# Patient Record
Sex: Male | Born: 1965 | Race: White | Hispanic: No | Marital: Single | Smoking: Current every day smoker
Health system: Southern US, Community
[De-identification: ages and names within clinical notes are randomized; demographics above are authoritative.]

## PROBLEM LIST (undated history)

## (undated) DIAGNOSIS — F319 Bipolar disorder, unspecified: Secondary | ICD-10-CM

---

## 2012-09-05 ENCOUNTER — Emergency Department (HOSPITAL_COMMUNITY): Payer: Self-pay

## 2012-09-05 ENCOUNTER — Emergency Department (HOSPITAL_COMMUNITY)
Admission: EM | Admit: 2012-09-05 | Discharge: 2012-09-05 | Disposition: A | Discharge status: Home / Self Care | Payer: Self-pay | Attending: Emergency Medicine | Admitting: Emergency Medicine

## 2012-09-05 ENCOUNTER — Encounter (HOSPITAL_COMMUNITY): Payer: Self-pay | Admitting: Emergency Medicine

## 2012-09-05 DIAGNOSIS — Z8659 Personal history of other mental and behavioral disorders: Secondary | ICD-10-CM | POA: Insufficient documentation

## 2012-09-05 DIAGNOSIS — R6883 Chills (without fever): Secondary | ICD-10-CM | POA: Insufficient documentation

## 2012-09-05 DIAGNOSIS — R05 Cough: Secondary | ICD-10-CM | POA: Insufficient documentation

## 2012-09-05 DIAGNOSIS — F17200 Nicotine dependence, unspecified, uncomplicated: Secondary | ICD-10-CM | POA: Insufficient documentation

## 2012-09-05 DIAGNOSIS — J3489 Other specified disorders of nose and nasal sinuses: Secondary | ICD-10-CM | POA: Insufficient documentation

## 2012-09-05 DIAGNOSIS — R059 Cough, unspecified: Secondary | ICD-10-CM | POA: Insufficient documentation

## 2012-09-05 HISTORY — DX: Bipolar disorder, unspecified: F31.9

## 2012-09-05 MED ORDER — BENZONATATE 200 MG PO CAPS: 200.0000 mg | ORAL_CAPSULE | Freq: Three times a day (TID) | ORAL | Status: DC | PRN
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
## 2012-09-05 NOTE — ED Provider Notes (Signed)
History    CSN: [id number] Arrival date & time 09/05/12  3329  First MD Initiated Contact with Patient 09/05/12 [phone]     Chief Complaint  Patient presents with  . Cough   (Consider location/radiation/quality/duration/timing/severity/associated sxs/prior Treatment) HPI Comments: 47 y.o. Homeless Male with PMHx of bipolar disorder presents today complaining of cough that started three weeks ago. Pt states he was treated for pneumonia with Bactrim in [place], but left town after only 3 days of treatment. He arrives here in [place] afraid that pneumonia has not cleared. Cough is described as productive with yellow sputum with some associated chest pain. Admits congestion. Denies fever, nausea, vomiting.   Pt is a current smoker.   Patient is a 47 y.o. male presenting with cough.  Cough Associated symptoms: chills   Associated symptoms: no chest pain, no diaphoresis, no ear pain, no fever, no headaches, no rash, no rhinorrhea and no shortness of breath    Past Medical History  Diagnosis Date  . Bipolar 1 disorder    History reviewed. No pertinent past surgical history. History reviewed. No pertinent family history. History  Substance Use Topics  . Smoking status: Current Every Day Smoker -- 0.50 packs/day    Types: Cigarettes  . Smokeless tobacco: Not on file  . Alcohol Use: Yes     Comment: occasional     Review of Systems  Constitutional: Positive for chills. Negative for fever and diaphoresis.  HENT: Positive for congestion. Negative for ear pain, rhinorrhea, neck pain and neck stiffness.   Eyes: Negative for visual disturbance.  Respiratory: Positive for cough. Negative for apnea, chest tightness and shortness of breath.   Cardiovascular: Negative for chest pain and palpitations.  Gastrointestinal: Negative for nausea, vomiting, diarrhea and constipation.  Genitourinary: Negative for dysuria.  Musculoskeletal: Negative for gait problem.  Skin: Negative for rash.   Neurological: Negative for dizziness, weakness, light-headedness, numbness and headaches.    Allergies  Review of patient's allergies indicates no known allergies.  Home Medications  No current outpatient prescriptions on file. BP 136/78  Pulse 90  Temp(Src) 98.6 F (37 C) (Oral)  Resp 16  SpO2 99% Physical Exam  Nursing note and vitals reviewed. Constitutional: He is oriented to person, place, and time. He appears well-developed and well-nourished. No distress.  HENT:  Head: Normocephalic and atraumatic.  Eyes: Conjunctivae and EOM are normal.  Neck: Normal range of motion. Neck supple.  No meningeal signs  Cardiovascular: Normal rate, regular rhythm and normal heart sounds.  Exam reveals no gallop and no friction rub.   No murmur heard. Pulmonary/Chest: Effort normal. No respiratory distress. He has no wheezes. He exhibits no tenderness.  Lung sounds are diminished bilaterally  Abdominal: Soft. Bowel sounds are normal. He exhibits no distension. There is no tenderness. There is no rebound and no guarding.  Musculoskeletal: Normal range of motion. He exhibits no edema and no tenderness.  Neurological: He is alert and oriented to person, place, and time. No cranial nerve deficit.  Skin: Skin is warm and dry. He is not diaphoretic. No erythema.  Psychiatric:  nervous    ED Course  Procedures (including critical care time) Labs Reviewed - No data to display Dg Chest 2 View  09/05/2012   *RADIOLOGY REPORT*  Clinical Data: Cough, shortness of breath and diaphoresis.  CHEST - 2 VIEW  Comparison: None.  Findings: Trachea is midline.  Heart size normal.  Lungs are mildly hyperinflated but clear.  No pleural fluid.  IMPRESSION: No acute  findings.   Original Report Authenticated By: Leanna Battles, M.D.   1. Cough    Discharge Medication List as of 09/05/2012 11:33 AM    START taking these medications   Details  benzonatate (TESSALON) 200 MG capsule Take 1 capsule (200 mg total)  by mouth 3 (three) times daily as needed for cough., Starting 09/05/2012, Until Discontinued, Print        MDM  Pt CXR negative for acute infiltrate. Patients symptoms are consistent with URI, likely viral etiology. Discussed that antibiotics are not indicated for viral infections. Pt will be discharged with symptomatic treatment.  Verbalizes understanding and is agreeable with plan. Pt is hemodynamically stable & in NAD prior to dc.   Glade Nurse, PA-C 09/05/12 1149

## 2012-09-05 NOTE — ED Notes (Signed)
Pt reports for the last week he has had productive yellow cough. States he lives in a shelter and has been doing part time work but has been unable to work due to illness. Denies fever. States cold sweats. Pt current smoker.

## 2012-09-06 NOTE — ED Provider Notes (Signed)
Medical screening examination/treatment/procedure(s) were performed by non-physician practitioner and as supervising physician I was immediately available for consultation/collaboration.  Olivia Mackie, MD 09/06/12 1420

## 2012-11-01 ENCOUNTER — Inpatient Hospital Stay: Payer: Self-pay

## 2012-11-09 ENCOUNTER — Emergency Department (HOSPITAL_COMMUNITY): Payer: Self-pay

## 2012-11-09 ENCOUNTER — Encounter (HOSPITAL_COMMUNITY): Payer: Self-pay | Admitting: Emergency Medicine

## 2012-11-09 ENCOUNTER — Emergency Department (HOSPITAL_COMMUNITY)
Admission: EM | Admit: 2012-11-09 | Discharge: 2012-11-10 | Disposition: A | Discharge status: Home / Self Care | Payer: Self-pay | Attending: Emergency Medicine | Admitting: Emergency Medicine

## 2012-11-09 DIAGNOSIS — T07XXXA Unspecified multiple injuries, initial encounter: Secondary | ICD-10-CM | POA: Insufficient documentation

## 2012-11-09 DIAGNOSIS — F1010 Alcohol abuse, uncomplicated: Secondary | ICD-10-CM | POA: Insufficient documentation

## 2012-11-09 DIAGNOSIS — F17200 Nicotine dependence, unspecified, uncomplicated: Secondary | ICD-10-CM | POA: Insufficient documentation

## 2012-11-09 DIAGNOSIS — S6990XA Unspecified injury of unspecified wrist, hand and finger(s), initial encounter: Secondary | ICD-10-CM | POA: Insufficient documentation

## 2012-11-09 DIAGNOSIS — Z8659 Personal history of other mental and behavioral disorders: Secondary | ICD-10-CM | POA: Insufficient documentation

## 2012-11-09 DIAGNOSIS — S59909A Unspecified injury of unspecified elbow, initial encounter: Secondary | ICD-10-CM | POA: Insufficient documentation

## 2012-11-09 DIAGNOSIS — F10929 Alcohol use, unspecified with intoxication, unspecified: Secondary | ICD-10-CM

## 2012-11-09 LAB — CBC WITH DIFFERENTIAL/PLATELET
Eosinophils Absolute: 0.2 10*3/uL (ref 0.0–0.7)
HCT: 42.4 % (ref 39.0–52.0)
Hemoglobin: 14.6 g/dL (ref 13.0–17.0)
Lymphs Abs: 3.5 10*3/uL (ref 0.7–4.0)
MCH: 32.4 pg (ref 26.0–34.0)
MCV: 94 fL (ref 78.0–100.0)
Monocytes Absolute: 0.9 10*3/uL (ref 0.1–1.0)
Monocytes Relative: 9 % (ref 3–12)
Neutrophils Relative %: 53 % (ref 43–77)
RBC: 4.51 MIL/uL (ref 4.22–5.81)

## 2012-11-09 LAB — COMPREHENSIVE METABOLIC PANEL
Alkaline Phosphatase: 59 U/L (ref 39–117)
BUN: 5 mg/dL — ABNORMAL LOW (ref 6–23)
Creatinine, Ser: 0.69 mg/dL (ref 0.50–1.35)
GFR calc Af Amer: >90 mL/min (ref >90)
Glucose, Bld: 93 mg/dL (ref 70–99)
Potassium: 3.3 mEq/L — ABNORMAL LOW (ref 3.5–5.1)
Total Bilirubin: 0.4 mg/dL (ref 0.3–1.2)
Total Protein: 6.7 g/dL (ref 6.0–8.3)

## 2012-11-09 LAB — ETHANOL: Alcohol, Ethyl (B): 304 mg/dL — ABNORMAL HIGH (ref 0–11)

## 2012-11-09 LAB — POCT I-STAT, CHEM 8
BUN: <3 mg/dL — ABNORMAL LOW (ref 6–23)
Hemoglobin: 16 g/dL (ref 13.0–17.0)
Sodium: 144 mEq/L (ref 135–145)
TCO2: 24 mmol/L (ref 0–100)

## 2012-11-09 LAB — RAPID URINE DRUG SCREEN, HOSP PERFORMED
Cocaine: NOT DETECTED
Opiates: NOT DETECTED
Tetrahydrocannabinol: NOT DETECTED
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
## 2012-11-09 NOTE — ED Notes (Signed)
Pt. Agitated, roaming up to the desk and walking around in the hall requesting something to eat. Pt. Made aware that he can not eat until his x-rays are resulted. Notified EDP, Pollina, MD.

## 2012-11-09 NOTE — ED Notes (Signed)
Bed: WA21 Expected date:  Expected time:  Means of arrival:  Comments: ems ETOH
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
## 2012-12-01 ENCOUNTER — Inpatient Hospital Stay (HOSPITAL_COMMUNITY)
Admission: AD | Admit: 2012-12-01 | Discharge: 2012-12-05 | DRG: 897 | Disposition: A | Discharge status: Home / Self Care | Payer: No Typology Code available for payment source | Source: Intra-hospital | Attending: Psychiatry | Admitting: Psychiatry

## 2012-12-01 ENCOUNTER — Emergency Department (HOSPITAL_COMMUNITY)
Admission: EM | Admit: 2012-12-01 | Discharge: 2012-12-01 | Disposition: A | Discharge status: Other Acute Inpatient Hospital | Payer: Self-pay | Attending: Emergency Medicine | Admitting: Emergency Medicine

## 2012-12-01 ENCOUNTER — Encounter (HOSPITAL_COMMUNITY): Payer: Self-pay | Admitting: Emergency Medicine

## 2012-12-01 DIAGNOSIS — F1020 Alcohol dependence, uncomplicated: Secondary | ICD-10-CM | POA: Insufficient documentation

## 2012-12-01 DIAGNOSIS — Z5900 Homelessness unspecified: Secondary | ICD-10-CM | POA: Insufficient documentation

## 2012-12-01 DIAGNOSIS — F10239 Alcohol dependence with withdrawal, unspecified: Principal | ICD-10-CM | POA: Diagnosis present

## 2012-12-01 DIAGNOSIS — F319 Bipolar disorder, unspecified: Secondary | ICD-10-CM | POA: Insufficient documentation

## 2012-12-01 DIAGNOSIS — Z79899 Other long term (current) drug therapy: Secondary | ICD-10-CM | POA: Insufficient documentation

## 2012-12-01 DIAGNOSIS — F3110 Bipolar disorder, current episode manic without psychotic features, unspecified: Secondary | ICD-10-CM | POA: Diagnosis present

## 2012-12-01 DIAGNOSIS — F10939 Alcohol use, unspecified with withdrawal, unspecified: Principal | ICD-10-CM | POA: Diagnosis present

## 2012-12-01 DIAGNOSIS — R569 Unspecified convulsions: Secondary | ICD-10-CM | POA: Diagnosis present

## 2012-12-01 DIAGNOSIS — F17200 Nicotine dependence, unspecified, uncomplicated: Secondary | ICD-10-CM | POA: Insufficient documentation

## 2012-12-01 DIAGNOSIS — F10230 Alcohol dependence with withdrawal, uncomplicated: Secondary | ICD-10-CM | POA: Diagnosis present

## 2012-12-01 LAB — COMPREHENSIVE METABOLIC PANEL
ALT: 22 U/L (ref 0–53)
AST: 35 U/L (ref 0–37)
CO2: 25 mEq/L (ref 19–32)
Chloride: 105 mEq/L (ref 96–112)
GFR calc non Af Amer: >90 mL/min (ref >90)
Sodium: 142 mEq/L (ref 135–145)
Total Bilirubin: 0.2 mg/dL — ABNORMAL LOW (ref 0.3–1.2)

## 2012-12-01 LAB — CBC
Platelets: 303 10*3/uL (ref 150–400)
RBC: 4.49 MIL/uL (ref 4.22–5.81)
WBC: 12.1 10*3/uL — ABNORMAL HIGH (ref 4.0–10.5)

## 2012-12-01 LAB — RAPID URINE DRUG SCREEN, HOSP PERFORMED
Amphetamines: NOT DETECTED
Barbiturates: NOT DETECTED
Benzodiazepines: NOT DETECTED

## 2012-12-01 LAB — SALICYLATE LEVEL: Salicylate Lvl: <2 mg/dL — ABNORMAL LOW (ref 2.8–20.0)

## 2012-12-01 LAB — ETHANOL: Alcohol, Ethyl (B): 270 mg/dL — ABNORMAL HIGH (ref 0–11)

## 2012-12-01 MED ORDER — LORAZEPAM 1 MG PO TABS
0.0000 mg | ORAL_TABLET | Freq: Four times a day (QID) | ORAL | Status: DC
  Filled 2012-12-01: qty 2

## 2012-12-01 MED ORDER — NICOTINE 21 MG/24HR TD PT24
21.0000 mg | MEDICATED_PATCH | Freq: Every day | TRANSDERMAL | Status: DC
  Administered 2012-12-01: 21 mg via TRANSDERMAL
  Filled 2012-12-01: qty 1

## 2012-12-01 MED ORDER — IBUPROFEN 200 MG PO TABS: 600.0000 mg | ORAL_TABLET | Freq: Three times a day (TID) | ORAL | Status: DC | PRN

## 2012-12-01 MED ORDER — ACETAMINOPHEN 325 MG PO TABS: 650.0000 mg | ORAL_TABLET | ORAL | Status: DC | PRN

## 2012-12-01 MED ORDER — LORAZEPAM 1 MG PO TABS
0.0000 mg | ORAL_TABLET | Freq: Two times a day (BID) | ORAL | Status: DC
  Administered 2012-12-01: 2 mg via ORAL

## 2012-12-01 MED ORDER — TRAZODONE HCL 100 MG PO TABS
300.0000 mg | ORAL_TABLET | Freq: Every day | ORAL | Status: DC
  Administered 2012-12-01: 300 mg via ORAL
  Filled 2012-12-01: qty 3

## 2012-12-01 MED ORDER — ZOLPIDEM TARTRATE 5 MG PO TABS: 5.0000 mg | ORAL_TABLET | Freq: Every evening | ORAL | Status: DC | PRN

## 2012-12-01 MED ORDER — ONDANSETRON HCL 4 MG PO TABS: 4.0000 mg | ORAL_TABLET | Freq: Three times a day (TID) | ORAL | Status: DC | PRN

## 2012-12-01 MED ORDER — ALUM & MAG HYDROXIDE-SIMETH 200-200-20 MG/5ML PO SUSP: 30.0000 mL | ORAL | Status: DC | PRN
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
## 2012-12-01 NOTE — BH Assessment (Signed)
Assessment Note  Donald Orozco is an 47 y.o. male who presents to [facility] voluntarily with the chief complaint of excessive alcohol consumption. Patient states that he self medicates by consuming at a minimum of three 40oz beers daily in order to prevent "the shakes" per patient. Patient reports that he now has a job in [employer] and that he desires to get "clean" in order to maintain employment. Patient reports withdrawal symptoms that include: fever/chills, tremors, history of blackouts, sweats, and irritability. Patient's BAL on admission was 270. Patient reports he is currently homeless which influences his depressive symptoms that include: insomnia, feelings of irritability, guilt, and isolating behaviors.  Patient denies any other substance use (UDS substantiated with no other positive readings.) Patient reports that his current alcohol dependence interferes with his daily activities and that he now desires to receive help in battling his alcoholism.  Patient reports no current social supports and denies SI/HI/AVH at this time.    Axis I: Alcohol Dependence Axis II: Deferred Axis III:  Past Medical History  Diagnosis Date  . Bipolar 1 disorder    Axis IV: housing problems, other psychosocial or environmental problems, problems related to social environment and problems with primary support group Axis V: 41-50 serious symptoms  Past Medical History:  Past Medical History  Diagnosis Date  . Bipolar 1 disorder     History reviewed. No pertinent past surgical history.  Family History: History reviewed. No pertinent family history.  Social History:  reports that he has been smoking Cigarettes.  He has been smoking about 1.00 pack per day. He has never used smokeless tobacco. He reports that he drinks about 14.4 ounces of alcohol per week. He reports that he does not use illicit drugs.  Additional Social History:  Alcohol / Drug Use History of alcohol / drug use?: Yes Substance #1 Name of  Substance 1: ETOH 1 - Age of First Use: 14 1 - Amount (size/oz): At minimum (3) 40 oz beers daily 1 - Frequency: daily 1 - Duration: years 1 - Last Use / Amount: 12/01/12- (5) 40 oz beers   CIWA: CIWA-Ar BP: 143/77 mmHg Pulse Rate: 97 Nausea and Vomiting: mild nausea with no vomiting Tactile Disturbances: none Tremor: severe, even with arms not extended Auditory Disturbances: not present Paroxysmal Sweats: two Visual Disturbances: not present Anxiety: mildly anxious Headache, Fullness in Head: none present Agitation: normal activity Orientation and Clouding of Sensorium: oriented and can do serial additions CIWA-Ar Total: 11 COWS:    Allergies: No Known Allergies  Home Medications:  (Not in a hospital admission)  OB/GYN Status:  No LMP for male patient.  General Assessment Data Location of Assessment: [facility] ED Is this a Tele or Face-to-Face Assessment?: Face-to-Face Is this an Initial Assessment or a Re-assessment for this encounter?: Initial Assessment Living Arrangements: Other (Comment) (Homeless) Can pt return to current living arrangement?: Yes Admission Status: Voluntary Is patient capable of signing voluntary admission?: Yes Transfer from: Acute Hospital Referral Source: Self/Family/Friend     [facility] Crisis Care Plan Living Arrangements: Other (Comment) (Homeless) Name of Psychiatrist: None Name of Therapist: None  Education Status Is patient currently in school?: No  Risk to self Suicidal Ideation: No Suicidal Intent: No Is patient at risk for suicide?: No Suicidal Plan?: No Access to Means: No What has been your use of drugs/alcohol within the last 12 months?: None Previous Attempts/Gestures: No How many times?: 0 Other Self Harm Risks: None Triggers for Past Attempts: None known Intentional Self Injurious Behavior: None  Family Suicide History: No Recent stressful life event(s): Loss (Comment) (Patient is currently homeless) Persecutory  voices/beliefs?: No Depression: Yes Depression Symptoms: Tearfulness;Isolating;Fatigue;Loss of interest in usual pleasures;Feeling worthless/self pity;Feeling angry/irritable Substance abuse history and/or treatment for substance abuse?: No Suicide prevention information given to non-admitted patients: Not applicable  Risk to Others Homicidal Ideation: No Thoughts of Harm to Others: No Current Homicidal Intent: No Current Homicidal Plan: No Access to Homicidal Means: No Identified Victim: None History of harm to others?: No Assessment of Violence: None Noted Violent Behavior Description: None Does patient have access to weapons?: No Criminal Charges Pending?: No (Patient denies) Does patient have a court date: No  Psychosis Hallucinations: None noted Delusions: None noted  Mental Status Report Appear/Hygiene: Disheveled;Poor hygiene Eye Contact: Poor Motor Activity: Freedom of movement;Unremarkable Speech: Logical/coherent Level of Consciousness: Restless Mood: Depressed;Anxious Affect: Anxious;Depressed Anxiety Level: Minimal Thought Processes: Coherent;Relevant Judgement: Impaired Orientation: Person;Place;Time;Situation Obsessive Compulsive Thoughts/Behaviors: None  Cognitive Functioning Concentration: Decreased Memory: Recent Intact;Remote Intact IQ: Average Insight: Fair Impulse Control: Poor Appetite: Poor Weight Loss: 40 Weight Gain: 0 Sleep: Decreased Total Hours of Sleep:  (Varies) Vegetative Symptoms: None  ADLScreening [facility] Assessment Services) Patient's cognitive ability adequate to safely complete daily activities?: Yes Patient able to express need for assistance with ADLs?: Yes Independently performs ADLs?: Yes (appropriate for developmental age)  Prior Inpatient Therapy Prior Inpatient Therapy: Yes Prior Therapy Dates: "years ago" per pt Prior Therapy Facilty/Provider(s): Facility in [place] Reason for Treatment: Detox  Prior Outpatient  Therapy Prior Outpatient Therapy: No  ADL Screening (condition at time of admission) Patient's cognitive ability adequate to safely complete daily activities?: Yes Is the patient deaf or have difficulty hearing?: No Does the patient have difficulty seeing, even when wearing glasses/contacts?: No Does the patient have difficulty concentrating, remembering, or making decisions?: No Patient able to express need for assistance with ADLs?: Yes Does the patient have difficulty dressing or bathing?: No Independently performs ADLs?: Yes (appropriate for developmental age) Does the patient have difficulty walking or climbing stairs?: No Weakness of Legs: None Weakness of Arms/Hands: None  Home Assistive Devices/Equipment Home Assistive Devices/Equipment: None  Therapy Consults (therapy consults require a physician order) PT Evaluation Needed: No OT Evalulation Needed: No SLP Evaluation Needed: No Abuse/Neglect Assessment (Assessment to be complete while patient is alone) Physical Abuse: Denies Verbal Abuse: Denies Sexual Abuse: Denies Exploitation of patient/patient's resources: Denies Self-Neglect: Denies Values / Beliefs Cultural Requests During Hospitalization: None Spiritual Requests During Hospitalization: None Consults Spiritual Care Consult Needed: No Social Work Consult Needed: No Merchant [employer] officer (For Healthcare) Advance Directive: Patient does not have advance directive;Patient would not like information    Additional Information 1:1 In Past 12 Months?: No CIRT Risk: No Elopement Risk: No Does patient have medical clearance?: Yes     Disposition: Recommendation for inpatient admission-detox.  Disposition Initial Assessment Completed for this Encounter: Yes Disposition of Patient: Inpatient treatment program Type of inpatient treatment program: Adult  On Site Evaluation by:   Reviewed with Physician:    Janann Colonel C 12/01/2012 9:52 PM

## 2012-12-01 NOTE — ED Notes (Signed)
Pt reports that he wants to detox from alcohol.  Reports he recently left the [facility] and has been homeless for the past two weeks, and has a job at [employer] that he will lose if he cannot stay sober. Pt calm and cooperative in triage.

## 2012-12-01 NOTE — ED Provider Notes (Signed)
12/01/12 10:44 PM  I was not involved in this patient's care. He is going to be transferred to behavioral health for alcohol detox.  Layla Maw Jabrea Kallstrom, DO 12/01/12 2245

## 2012-12-01 NOTE — ED Provider Notes (Signed)
Medical screening examination/treatment/procedure(s) were performed by non-physician practitioner and as supervising physician I was immediately available for consultation/collaboration.    Gilda Crease, MD 12/01/12 Barry Brunner

## 2012-12-01 NOTE — ED Notes (Signed)
Pt changed into blue paper scrubs. Pt and belongings searched by security.  Pt has one black bag with clothing, cell phone, and billfold

## 2012-12-02 ENCOUNTER — Encounter (HOSPITAL_COMMUNITY): Payer: Self-pay | Admitting: Behavioral Health

## 2012-12-02 DIAGNOSIS — F10230 Alcohol dependence with withdrawal, uncomplicated: Secondary | ICD-10-CM | POA: Diagnosis present

## 2012-12-02 DIAGNOSIS — F3110 Bipolar disorder, current episode manic without psychotic features, unspecified: Secondary | ICD-10-CM | POA: Diagnosis present

## 2012-12-02 DIAGNOSIS — F10930 Alcohol use, unspecified with withdrawal, uncomplicated: Secondary | ICD-10-CM | POA: Diagnosis present

## 2012-12-02 MED ORDER — TRAZODONE HCL 100 MG PO TABS
100.0000 mg | ORAL_TABLET | Freq: Every evening | ORAL | Status: DC | PRN
  Administered 2012-12-02 – 2012-12-04 (×3): 100 mg via ORAL
  Filled 2012-12-02 (×2): qty 1
  Filled 2012-12-02: qty 14
  Filled 2012-12-02: qty 1

## 2012-12-02 MED ORDER — CHLORDIAZEPOXIDE HCL 25 MG PO CAPS: 25.0000 mg | ORAL_CAPSULE | Freq: Every day | ORAL | Status: DC

## 2012-12-02 MED ORDER — ADULT MULTIVITAMIN W/MINERALS CH
1.0000 | ORAL_TABLET | Freq: Every day | ORAL | Status: DC
  Administered 2012-12-02 – 2012-12-05 (×4): 1 via ORAL
  Filled 2012-12-02 (×6): qty 1

## 2012-12-02 MED ORDER — LORAZEPAM 1 MG PO TABS: 1.0000 mg | ORAL_TABLET | Freq: Four times a day (QID) | ORAL | Status: DC | PRN

## 2012-12-02 MED ORDER — ACETAMINOPHEN 325 MG PO TABS: 650.0000 mg | ORAL_TABLET | Freq: Four times a day (QID) | ORAL | Status: DC | PRN

## 2012-12-02 MED ORDER — CHLORDIAZEPOXIDE HCL 25 MG PO CAPS
25.0000 mg | ORAL_CAPSULE | Freq: Four times a day (QID) | ORAL | Status: AC | PRN
  Filled 2012-12-02: qty 1

## 2012-12-02 MED ORDER — NICOTINE 21 MG/24HR TD PT24
21.0000 mg | MEDICATED_PATCH | Freq: Every day | TRANSDERMAL | Status: DC
  Administered 2012-12-02: 21 mg via TRANSDERMAL
  Filled 2012-12-02 (×3): qty 1

## 2012-12-02 MED ORDER — FOLIC ACID 1 MG PO TABS
1.0000 mg | ORAL_TABLET | Freq: Every day | ORAL | Status: DC
  Administered 2012-12-02 – 2012-12-05 (×4): 1 mg via ORAL
  Filled 2012-12-02 (×6): qty 1

## 2012-12-02 MED ORDER — CHLORDIAZEPOXIDE HCL 25 MG PO CAPS
25.0000 mg | ORAL_CAPSULE | Freq: Four times a day (QID) | ORAL | Status: AC
  Administered 2012-12-02 – 2012-12-03 (×5): 25 mg via ORAL
  Filled 2012-12-02 (×6): qty 1

## 2012-12-02 MED ORDER — LORAZEPAM 1 MG PO TABS
0.0000 mg | ORAL_TABLET | Freq: Four times a day (QID) | ORAL | Status: DC
  Administered 2012-12-02 (×2): 1 mg via ORAL
  Filled 2012-12-02 (×3): qty 1

## 2012-12-02 MED ORDER — ONDANSETRON 4 MG PO TBDP: 4.0000 mg | ORAL_TABLET | Freq: Four times a day (QID) | ORAL | Status: AC | PRN

## 2012-12-02 MED ORDER — LORAZEPAM 1 MG PO TABS: 0.0000 mg | ORAL_TABLET | Freq: Two times a day (BID) | ORAL | Status: DC

## 2012-12-02 MED ORDER — ALUM & MAG HYDROXIDE-SIMETH 200-200-20 MG/5ML PO SUSP: 30.0000 mL | ORAL | Status: DC | PRN

## 2012-12-02 MED ORDER — LOPERAMIDE HCL 2 MG PO CAPS: 2.0000 mg | ORAL_CAPSULE | ORAL | Status: AC | PRN

## 2012-12-02 MED ORDER — THIAMINE HCL 100 MG/ML IJ SOLN: 100.0000 mg | Freq: Every day | INTRAMUSCULAR | Status: DC

## 2012-12-02 MED ORDER — VITAMIN B-1 100 MG PO TABS
100.0000 mg | ORAL_TABLET | Freq: Every day | ORAL | Status: DC
  Administered 2012-12-02 – 2012-12-05 (×4): 100 mg via ORAL
  Filled 2012-12-02 (×6): qty 1

## 2012-12-02 MED ORDER — CHLORDIAZEPOXIDE HCL 25 MG PO CAPS
25.0000 mg | ORAL_CAPSULE | ORAL | Status: DC
  Administered 2012-12-05: 25 mg via ORAL
  Filled 2012-12-02: qty 1

## 2012-12-02 MED ORDER — CHLORDIAZEPOXIDE HCL 25 MG PO CAPS
25.0000 mg | ORAL_CAPSULE | Freq: Three times a day (TID) | ORAL | Status: AC
  Administered 2012-12-04 (×3): 25 mg via ORAL
  Filled 2012-12-02 (×3): qty 1

## 2012-12-02 MED ORDER — LORAZEPAM 2 MG/ML IJ SOLN: 1.0000 mg | Freq: Four times a day (QID) | INTRAMUSCULAR | Status: DC | PRN

## 2012-12-02 MED ORDER — INFLUENZA VAC SPLIT QUAD 0.5 ML IM SUSP
0.5000 mL | INTRAMUSCULAR | Status: AC
Start: 2012-12-03 — End: 2012-12-03
  Administered 2012-12-03: 0.5 mL via INTRAMUSCULAR
  Filled 2012-12-02: qty 0.5

## 2012-12-02 MED ORDER — MAGNESIUM HYDROXIDE 400 MG/5ML PO SUSP: 30.0000 mL | Freq: Every day | ORAL | Status: DC | PRN

## 2012-12-02 MED ORDER — NICOTINE 21 MG/24HR TD PT24
MEDICATED_PATCH | TRANSDERMAL | Status: AC
  Administered 2012-12-02: 21 mg via TRANSDERMAL
  Filled 2012-12-02: qty 1

## 2012-12-02 MED ORDER — CHLORDIAZEPOXIDE HCL 25 MG PO CAPS
50.0000 mg | ORAL_CAPSULE | Freq: Once | ORAL | Status: AC
  Administered 2012-12-02: 50 mg via ORAL

## 2012-12-02 MED ORDER — CARBAMAZEPINE 200 MG PO TABS
200.0000 mg | ORAL_TABLET | Freq: Three times a day (TID) | ORAL | Status: DC
  Administered 2012-12-02 – 2012-12-05 (×10): 200 mg via ORAL
  Filled 2012-12-02 (×6): qty 1
  Filled 2012-12-02: qty 42
  Filled 2012-12-02 (×2): qty 1
  Filled 2012-12-02: qty 42
  Filled 2012-12-02 (×2): qty 1
  Filled 2012-12-02: qty 42
  Filled 2012-12-02 (×4): qty 1

## 2012-12-02 NOTE — Progress Notes (Signed)
Adult Psychoeducational Group Note  Date:  12/02/2012 Time:  8:00pm Group Topic/Focus:  Wrap-Up Group:   The focus of this group is to help patients review their daily goal of treatment and discuss progress on daily workbooks.  Participation Level:  Active  Participation Quality:  Appropriate and Attentive  Affect:  Appropriate  Cognitive:  Alert and Appropriate  Insight: Appropriate  Engagement in Group:  Engaged  Modes of Intervention:  Discussion and Education  Additional Comments:  Pt attended and participated in group. When ask how did his day go pt stated he slept a lot today from being so tired but a good day.When ask what his goal was pt stated meet with social worker call his boss and inform him of what's going on.  Shelly Bombard D 12/02/2012, 8:51 PM

## 2012-12-02 NOTE — Progress Notes (Signed)
Adult Psychoeducational Group Note  Date:  12/02/2012 Time:  0900  Group Topic/Focus:  Spirituality:   The focus of this group is to discuss how one's spirituality can aide in recovery.  Participation Level:  Did Not Attend  Pt decided not to attend Lilibeth Opie Shari Prows 12/02/2012, 11:51 AM

## 2012-12-02 NOTE — BHH Suicide Risk Assessment (Signed)
Suicide Risk Assessment  Admission Assessment     Nursing information obtained from:  Patient Demographic factors:  Low socioeconomic status;Male;Caucasian;Unemployed Current Mental Status:  NA Loss Factors:  Financial problems / change in socioeconomic status Historical Factors:  NA Risk Reduction Factors:  NA  CLINICAL FACTORS:   Severe Anxiety and/or Agitation Bipolar Disorder:   Depressive phase Depression:   Anhedonia Comorbid alcohol abuse/dependence Hopelessness Impulsivity Insomnia Recent sense of peace/wellbeing Severe Alcohol/Substance Abuse/Dependencies Previous Psychiatric Diagnoses and Treatments  COGNITIVE FEATURES THAT CONTRIBUTE TO RISK:  Closed-mindedness Loss of executive function Polarized thinking Thought constriction (tunnel vision)    SUICIDE RISK:   Mild:  Suicidal ideation of limited frequency, intensity, duration, and specificity.  There are no identifiable plans, no associated intent, mild dysphoria and related symptoms, good self-control (both objective and subjective assessment), few other risk factors, and identifiable protective factors, including available and accessible social support.  PLAN OF CARE: Admit voluntarily and emergently from [facility] long emergency department for alcohol detox treatment.  I certify that inpatient services furnished can reasonably be expected to improve the patient's condition.  Lutricia Widjaja,JANARDHAHA R. 12/02/2012, 2:36 PM

## 2012-12-02 NOTE — Tx Team (Addendum)
Initial Interdisciplinary Treatment Plan  PATIENT STRENGTHS: (choose at least two) Communication skills Physical Health  PATIENT STRESSORS: Financial difficulties Substance abuse   PROBLEM LIST: Problem List/Patient Goals Date to be addressed Date deferred Reason deferred Estimated date of resolution  Alcohol abuse 12/02/12                                                      DISCHARGE CRITERIA:  Ability to meet basic life and health needs Adequate post-discharge living arrangements Medical problems require only outpatient monitoring Withdrawal symptoms are absent or subacute and managed without 24-hour nursing intervention  PRELIMINARY DISCHARGE PLAN: Attend 12-step recovery group  PATIENT/FAMIILY INVOLVEMENT: This treatment plan has been presented to and reviewed with the patient, Dejon Lukas, and/or family member.  The patient and family have been given the opportunity to ask questions and make suggestions.  Leda Quail T 12/02/2012, 1:42 AM

## 2012-12-02 NOTE — Progress Notes (Signed)
Pt is a 47 year old male admitted requesting alcohol detox. Pt works at [employer] and wants to be sober so he can keep his job. He is currently homeless after leaving the [facility] 2 weeks ago. Pt reports a daily alcohol consumption of 3 40oz beverages daily. CIWA is currently a 4. Pt is very drowsy at this time due to receiving 300mg  of Trazodone before arrival and reporting not sleeping for past few days so it is difficult to get much information out of him before he is falling asleep. Pt is considered at high fall risk at this time because of this. Pt denies SI/HI/AVH. Pt reports no significant medical history/surgeries but he does have a diagnosis of bipolar disorder in which he has not been taking any medications for. Pt explained treatment rules and skin assessed by RN. There were no noted scars, wounds or tattoos present. Pt belongings searched and placed in locker. Pt oriented to unit and room. No complaints of pain or discomfort at this time. Q15 min safety checks maintained. Will continue to monitor pt.

## 2012-12-02 NOTE — [facility] Group Notes (Signed)
[facility] Group Notes:  (Clinical Social Work)  12/02/2012   3:00-4:00PM  Summary of Progress/Problems:   The main focus of today's process group was to   identify the patient's current support system and decide on other supports that can be put in place.  The picture on workbook was used to discuss why additional supports are needed, and a hand-out was distributed with four definitions/levels of support, then used to talk about how patients have given and received all different kinds of support.  An emphasis was placed on using counselor, doctor, therapy groups, 12-step groups, and problem-specific support groups to expand supports.  The patient identified one additional support as being return to AA, getting a doctor and therapist here.  Type of Therapy:  Process Group  Participation Level:  Active  Participation Quality:  Attentive  Affect:  Blunted  Cognitive:  Oriented  Insight:  Developing/Improving  Engagement in Therapy:  Developing/Improving  Modes of Intervention:  Education,  Support and Processing  Ambrose Mantle, LCSW 12/02/2012, 2:56 PM

## 2012-12-02 NOTE — H&P (Signed)
Psychiatric Admission Assessment Adult  Patient Identification:  Donald Orozco Date of Evaluation:  12/02/2012 Chief Complaint:  ETOH DEPENDENCE History of Present Illness:The patient presented to the [facility] requesting assistance with alcohol detox in order to save a job he has at [employer].  He is currently homeless and reports drinking 3 40oz beers a day for the past 2 weeks. He also notes that he has a history of withdrawal seizures and thinks that he had one Friday.     He denies any drug use. He is from [place] and has been in [place] for 3 months. States he is bipolar and is currently "manic" because the police [place]. He says he usually takes Trazodone and Tegratol. Elements:  Location:  Adult in patient unit. Quality:  chronic. Severity:  severe . Timing:  worsening since Friday. Duration:  2 weeks of daily drinking. Context:  homeless, but employed, wants to return to work on Monday if possible. Associated Signs/Synptoms: Depression Symptoms:  denies (Hypo) Manic Symptoms:  Distractibility, Grandiosity, Impulsivity, Anxiety Symptoms:  denies Psychotic Symptoms:  denies PTSD Symptoms: denies Psychiatric Specialty Exam: Physical Exam  Constitutional: He appears well-developed and well-nourished.  HENT:  Head: Normocephalic and atraumatic.  Skin: He is diaphoretic.  Psychiatric: Thought content normal. His mood appears anxious. His speech is rapid and/or pressured. He is agitated. Cognition and memory are impaired. He expresses impulsivity. He does not express inappropriate judgment.  Patient is seen and the chart is reviewed. I agree with the findings of the exam completed in the ED with no exceptions at this time.  Patient is in active withdrawal at this time.    Review of Systems  Constitutional: Positive for diaphoresis.  Gastrointestinal: Positive for nausea.  Neurological: Positive for tremors and headaches.  Psychiatric/Behavioral: The patient  is nervous/anxious.     Blood pressure 143/83, pulse 66, temperature 97 F (36.1 C), temperature source Oral, resp. rate 16, height 5\' 9"  (1.753 m), weight 77.565 kg (171 lb).Body mass index is 25.24 kg/(m^2).  General Appearance: Disheveled  Eye Contact::  Minimal  Speech:  Pressured  Volume:  Increased  Mood:  Irritable  Affect:  Labile  Thought Process:  Circumstantial  Orientation:  Full (Time, Place, and Person)  Thought Content:  WDL  Suicidal Thoughts:  No  Homicidal Thoughts:  No  Memory:  NA  Judgement:  Impaired  Insight:  Lacking  Psychomotor Activity:  Tremor  Concentration:  Poor  Recall:  Poor  Akathisia:  No  Handed:  Right  AIMS (if indicated):     Assets:  Communication Skills Desire for Improvement Talents/Skills  Sleep:  Number of Hours: 4.5    Past Psychiatric History: Diagnosis:  Hospitalizations:  Outpatient Care:  Substance Abuse Care:  Self-Mutilation:  Suicidal Attempts:  Violent Behaviors:   Past Medical History:   Past Medical History  Diagnosis Date  . Bipolar 1 disorder    Seizure History:  patient reports a history of withdrawal seizures Allergies:  No Known Allergies PTA Medications: Prescriptions prior to admission  Medication Sig Dispense Refill  . traZODone (DESYREL) 150 MG tablet Take 300 mg by mouth at bedtime.        Previous Psychotropic Medications:  Medication/Dose                 Substance Abuse History in the last 12 months:  yes  Consequences of Substance Abuse: Medical Consequences:  seizures  Social History:  reports that he has been  smoking Cigarettes.  He has been smoking about 1.00 pack per day. He has never used smokeless tobacco. He reports that he drinks about 14.4 ounces of alcohol per week. He reports that he does not use illicit drugs. Additional Social History:                      Current Place of Residence:   Place of Birth:   Family Members: Marital Status:   Single Children:  Sons:  Daughters: Relationships: Education:  10th grade Educational Problems/Performance: Religious Beliefs/Practices: History of Abuse (Emotional/Phsycial/Sexual) Teacher, music History:  None. Legal History: Hobbies/Interests:  Family History:  History reviewed. No pertinent family history.  Results for orders placed during the hospital encounter of 12/01/12 (from the past 72 hour(s))  URINE RAPID DRUG SCREEN (HOSP PERFORMED)     Status: None   Collection Time    12/01/12  7:21 PM      Result Value Range   Opiates NONE DETECTED  NONE DETECTED   Cocaine NONE DETECTED  NONE DETECTED   Benzodiazepines NONE DETECTED  NONE DETECTED   Amphetamines NONE DETECTED  NONE DETECTED   Tetrahydrocannabinol NONE DETECTED  NONE DETECTED   Barbiturates NONE DETECTED  NONE DETECTED   Comment:            DRUG SCREEN FOR MEDICAL PURPOSES     ONLY.  IF CONFIRMATION IS NEEDED     FOR ANY PURPOSE, NOTIFY LAB     WITHIN 5 DAYS.                LOWEST DETECTABLE LIMITS     FOR URINE DRUG SCREEN     Drug Class       Cutoff (ng/mL)     Amphetamine      1000     Barbiturate      200     Benzodiazepine   200     Tricyclics       300     Opiates          300     Cocaine          300     THC              50  CBC     Status: Abnormal   Collection Time    12/01/12  7:30 PM      Result Value Range   WBC 12.1 (*) 4.0 - 10.5 K/uL   RBC 4.49  4.22 - 5.81 MIL/uL   Hemoglobin 14.8  13.0 - 17.0 g/dL   HCT 81.1  91.4 - 78.2 %   MCV 96.0  78.0 - 100.0 fL   MCH 33.0  26.0 - 34.0 pg   MCHC 34.3  30.0 - 36.0 g/dL   RDW 95.6  21.3 - 08.6 %   Platelets 303  150 - 400 K/uL  COMPREHENSIVE METABOLIC PANEL     Status: Abnormal   Collection Time    12/01/12  7:30 PM      Result Value Range   Sodium 142  135 - 145 mEq/L   Potassium 3.8  3.5 - 5.1 mEq/L   Chloride 105  96 - 112 mEq/L   CO2 25  19 - 32 mEq/L   Glucose, Bld 110 (*) 70 - 99 mg/dL   BUN 8  6 - 23 mg/dL    Creatinine, Ser 5.78  0.50 - 1.35 mg/dL   Calcium 8.2 (*) 8.4 -  10.5 mg/dL   Total Protein 6.8  6.0 - 8.3 g/dL   Albumin 3.4 (*) 3.5 - 5.2 g/dL   AST 35  0 - 37 U/L   ALT 22  0 - 53 U/L   Alkaline Phosphatase 58  39 - 117 U/L   Total Bilirubin 0.2 (*) 0.3 - 1.2 mg/dL   GFR calc non Af Amer >90  >90 mL/min   GFR calc Af Amer >90  >90 mL/min   Comment: (NOTE)     The eGFR has been calculated using the CKD EPI equation.     This calculation has not been validated in all clinical situations.     eGFR's persistently <90 mL/min signify possible Chronic Kidney     Disease.  ETHANOL     Status: Abnormal   Collection Time    12/01/12  7:30 PM      Result Value Range   Alcohol, Ethyl (B) 270 (*) 0 - 11 mg/dL   Comment:            LOWEST DETECTABLE LIMIT FOR     SERUM ALCOHOL IS 11 mg/dL     FOR MEDICAL PURPOSES ONLY  ACETAMINOPHEN LEVEL     Status: None   Collection Time    12/01/12  7:30 PM      Result Value Range   Acetaminophen (Tylenol), Serum <15.0  10 - 30 ug/mL   Comment:            THERAPEUTIC CONCENTRATIONS VARY     SIGNIFICANTLY. A RANGE OF 10-30     ug/mL MAY BE AN EFFECTIVE     CONCENTRATION FOR MANY PATIENTS.     HOWEVER, SOME ARE BEST TREATED     AT CONCENTRATIONS OUTSIDE THIS     RANGE.     ACETAMINOPHEN CONCENTRATIONS     >150 ug/mL AT 4 HOURS AFTER     INGESTION AND >50 ug/mL AT 12     HOURS AFTER INGESTION ARE     OFTEN ASSOCIATED WITH TOXIC     REACTIONS.  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 SIMDO  AXIS I:  Bipolar, Manic AXIS II:   Deferred AXIS III:   Past Medical History  Diagnosis Date  . Bipolar 1 disorder    AXIS IV:  occupational problems, problems related to social environment, problems with access to health care services and problems with primary support group AXIS V:  41-50 serious symptoms  Treatment Plan/Recommendations:   1. Admit for crisis management and stabilization and alcohol detox 2. Medication management to reduce current symptoms to base line and improve the patient's overall level of functioning. 3. Treat health problems as indicated. 4. Develop treatment plan to decrease risk of relapse upon discharge and to reduce the need for readmission. 5. Psycho-social education regarding relapse prevention and self care. 6. Health care follow up as needed for medical problems. 7. Restart home medications where appropriate. 8. Start Librium detox. 9. Will restart Tegretol for bipolar symptoms Treatment Plan Summary: Daily contact with patient to assess and evaluate symptoms and progress  in treatment Medication management Current Medications:  Current Facility-Administered Medications  Medication Dose Route Frequency Provider Last Rate Last Dose  . acetaminophen (TYLENOL) tablet 650 mg  650 mg Oral Q6H PRN Evanna Janann August, NP      . alum & mag hydroxide-simeth (MAALOX/MYLANTA) 200-200-20 MG/5ML suspension 30 mL  30 mL Oral Q4H PRN Evanna Janann August, NP      . folic acid (FOLVITE) tablet 1 mg  1 mg Oral Daily Evanna Janann August, NP   1 mg at 12/02/12 0823  . LORazepam (ATIVAN) tablet 1 mg  1 mg Oral Q6H PRN Evanna Janann August, NP       Or  . LORazepam (ATIVAN) injection 1 mg  1 mg Intravenous Q6H PRN Evanna Cori Merry Proud, NP      . LORazepam (ATIVAN) tablet 0-4 mg  0-4 mg Oral Q6H Evanna Janann August, NP   1 mg at 12/02/12 1248   Followed by  . [START ON 12/04/2012] LORazepam (ATIVAN) tablet 0-4 mg  0-4 mg Oral Q12H Evanna Cori Burkett, NP      . magnesium hydroxide (MILK OF MAGNESIA) suspension  30 mL  30 mL Oral Daily PRN Evanna Janann August, NP      . multivitamin with minerals tablet 1 tablet  1 tablet Oral Daily Evanna Janann August, NP   1 tablet at 12/02/12 [phone]  . thiamine (VITAMIN B-1) tablet 100 mg  100 mg Oral Daily Evanna Cori Merry Proud, NP   100 mg at 12/02/12 9604   Or  . thiamine (B-1) injection 100 mg  100 mg Intravenous Daily Evanna Janann August, NP      . traZODone (DESYREL) tablet 100 mg  100 mg Oral QHS PRN Evanna Janann August, NP        Observation Level/Precautions:  Detox  Laboratory:  Reviewed BAL is >200  Psychotherapy:    Medications:  Librium and Tegretol  Consultations:  If needed  Discharge Concerns:  Access to follow up care  Estimated LOS:  3-4 days  Other:     I certify that inpatient services furnished can reasonably be expected to improve the patient's condition.   Rona Ravens. [place]burn RPAC 1:26 PM 12/02/2012  Patient seen personally and completed suicide risk assessment, case discussed with a physician extender and made treatment plan.Reviewed the information documented and agree with the treatment plan.  Tamikka Pilger,JANARDHAHA R. 12/02/2012 3:59 PM

## 2012-12-03 MED ORDER — SALINE SPRAY 0.65 % NA SOLN
1.0000 | NASAL | Status: DC | PRN
  Administered 2012-12-03 – 2012-12-05 (×4): 1 via NASAL
  Filled 2012-12-03: qty 44

## 2012-12-03 MED ORDER — BENZOCAINE 10 % MT GEL
Freq: Four times a day (QID) | OROMUCOSAL | Status: DC | PRN
  Administered 2012-12-03: 13:00:00 via OROMUCOSAL

## 2012-12-03 NOTE — Progress Notes (Signed)
[facility] MD Progress Note  12/03/2012 1:35 PM Joyce Leckey  MRN:  [id number] Subjective:  "I'm weak from the Librium, I'm not used to it.  Patient reports he is still having tremors, but is already fast at work making arrangements to return to work as soon as his detox is complete. He notes that he had a fair night of sleep, but was up and down.      He still has significantly visible tremors, his speech is still circumstantial pressured speech. Diagnosis:   DSM5: Schizophrenia Disorders:   Obsessive-Compulsive Disorders:   Trauma-Stressor Disorders:   Substance/Addictive Disorders:  Alcohol Withdrawal without Perceptual Disturbances (F10.239) Depressive Disorders:  Bipolar disorder recurrent most recent episode is manic  Axis I: Bipolar, Manic  ADL's:  Impaired  Sleep: Fair  Appetite:  Fair  Suicidal Ideation:  denies Homicidal Ideation:  denies AEB (as evidenced by):  Psychiatric Specialty Exam: ROS  Blood pressure 123/85, pulse 103, temperature 97 F (36.1 C), temperature source Oral, resp. rate 16, height 5\' 9"  (1.753 m), weight 77.565 kg (171 lb).Body mass index is 25.24 kg/(m^2).  General Appearance: Disheveled  Eye Solicitor::  Fair  Speech:  Pressured  Volume:  Normal  Mood:  Grandiose and self important  Affect:  congruent  Thought Process:  Circumstantial  Orientation:  Full (Time, Place, and Person)  Thought Content:  WDL  Suicidal Thoughts:  No  Homicidal Thoughts:  No  Memory:  Immediate;   Fair  Judgement:  Poor  Insight:  Shallow  Psychomotor Activity:  Restlessness and Tremor  Concentration:  Poor  Recall:  Fair  Akathisia:  No  Handed:  Right  AIMS (if indicated):     Assets:  Desire for Improvement  Sleep:  Number of Hours: 6.75   Current Medications: Current Facility-Administered Medications  Medication Dose Route Frequency Provider Last Rate Last Dose  . acetaminophen (TYLENOL) tablet 650 mg  650 mg Oral Q6H PRN Evanna Janann August, NP      . alum  & mag hydroxide-simeth (MAALOX/MYLANTA) 200-200-20 MG/5ML suspension 30 mL  30 mL Oral Q4H PRN Evanna Janann August, NP      . benzocaine (ORAJEL) 10 % mucosal gel   Mouth/Throat QID PRN Verne Spurr, PA-C      . carbamazepine (TEGRETOL) tablet 200 mg  200 mg Oral TID Verne Spurr, PA-C   200 mg at 12/03/12 1146  . chlordiazePOXIDE (LIBRIUM) capsule 25 mg  25 mg Oral Q6H PRN Verne Spurr, PA-C      . chlordiazePOXIDE (LIBRIUM) capsule 25 mg  25 mg Oral QID Verne Spurr, PA-C   25 mg at 12/03/12 1146   Followed by  . [START ON 12/04/2012] chlordiazePOXIDE (LIBRIUM) capsule 25 mg  25 mg Oral TID Verne Spurr, PA-C       Followed by  . [START ON 12/05/2012] chlordiazePOXIDE (LIBRIUM) capsule 25 mg  25 mg Oral BH-qamhs Verne Spurr, PA-C       Followed by  . [START ON 12/06/2012] chlordiazePOXIDE (LIBRIUM) capsule 25 mg  25 mg Oral Daily Verne Spurr, PA-C      . folic acid (FOLVITE) tablet 1 mg  1 mg Oral Daily Evanna Janann August, NP   1 mg at 12/03/12 0819  . loperamide (IMODIUM) capsule 2-4 mg  2-4 mg Oral PRN Verne Spurr, PA-C      . magnesium hydroxide (MILK OF MAGNESIA) suspension 30 mL  30 mL Oral Daily PRN Evanna Janann August, NP      . multivitamin with  minerals tablet 1 tablet  1 tablet Oral Daily Evanna Janann August, NP   1 tablet at 12/03/12 0819  . nicotine (NICODERM CQ - dosed in mg/24 hours) patch 21 mg  21 mg Transdermal Q0600 Nehemiah Settle, MD   21 mg at 12/02/12 2006  . ondansetron (ZOFRAN-ODT) disintegrating tablet 4 mg  4 mg Oral Q6H PRN Verne Spurr, PA-C      . sodium chloride (OCEAN) 0.65 % nasal spray 1 spray  1 spray Each Nare PRN Verne Spurr, PA-C   1 spray at 12/03/12 1301  . thiamine (VITAMIN B-1) tablet 100 mg  100 mg Oral Daily Evanna Cori Merry Proud, NP   100 mg at 12/03/12 0819  . traZODone (DESYREL) tablet 100 mg  100 mg Oral QHS PRN Audrea Muscat, NP   100 mg at 12/02/12 2106    Lab Results:  Results for orders placed during the hospital  encounter of 12/01/12 (from the past 48 hour(s))  URINE RAPID DRUG SCREEN (HOSP PERFORMED)     Status: None   Collection Time    12/01/12  7:21 PM      Result Value Range   Opiates NONE DETECTED  NONE DETECTED   Cocaine NONE DETECTED  NONE DETECTED   Benzodiazepines NONE DETECTED  NONE DETECTED   Amphetamines NONE DETECTED  NONE DETECTED   Tetrahydrocannabinol NONE DETECTED  NONE DETECTED   Barbiturates NONE DETECTED  NONE DETECTED   Comment:            DRUG SCREEN FOR MEDICAL PURPOSES     ONLY.  IF CONFIRMATION IS NEEDED     FOR ANY PURPOSE, NOTIFY LAB     WITHIN 5 DAYS.                LOWEST DETECTABLE LIMITS     FOR URINE DRUG SCREEN     Drug Class       Cutoff (ng/mL)     Amphetamine      1000     Barbiturate      200     Benzodiazepine   200     Tricyclics       300     Opiates          300     Cocaine          300     THC              50  CBC     Status: Abnormal   Collection Time    12/01/12  7:30 PM      Result Value Range   WBC 12.1 (*) 4.0 - 10.5 K/uL   RBC 4.49  4.22 - 5.81 MIL/uL   Hemoglobin 14.8  13.0 - 17.0 g/dL   HCT 16.1  09.6 - 04.5 %   MCV 96.0  78.0 - 100.0 fL   MCH 33.0  26.0 - 34.0 pg   MCHC 34.3  30.0 - 36.0 g/dL   RDW 40.9  81.1 - 91.4 %   Platelets 303  150 - 400 K/uL  COMPREHENSIVE METABOLIC PANEL     Status: Abnormal   Collection Time    12/01/12  7:30 PM      Result Value Range   Sodium 142  135 - 145 mEq/L   Potassium 3.8  3.5 - 5.1 mEq/L   Chloride 105  96 - 112 mEq/L   CO2 25  19 - 32 mEq/L   Glucose, Bld  110 (*) 70 - 99 mg/dL   BUN 8  6 - 23 mg/dL   Creatinine, Ser 1.61  0.50 - 1.35 mg/dL   Calcium 8.2 (*) 8.4 - 10.5 mg/dL   Total Protein 6.8  6.0 - 8.3 g/dL   Albumin 3.4 (*) 3.5 - 5.2 g/dL   AST 35  0 - 37 U/L   ALT 22  0 - 53 U/L   Alkaline Phosphatase 58  39 - 117 U/L   Total Bilirubin 0.2 (*) 0.3 - 1.2 mg/dL   GFR calc non Af Amer >90  >90 mL/min   GFR calc Af Amer >90  >90 mL/min   Comment: (NOTE)     The eGFR has been  calculated using the CKD EPI equation.     This calculation has not been validated in all clinical situations.     eGFR's persistently <90 mL/min signify possible Chronic Kidney     Disease.  ETHANOL     Status: Abnormal   Collection Time    12/01/12  7:30 PM      Result Value Range   Alcohol, Ethyl (B) 270 (*) 0 - 11 mg/dL   Comment:            LOWEST DETECTABLE LIMIT FOR     SERUM ALCOHOL IS 11 mg/dL     FOR MEDICAL PURPOSES ONLY  ACETAMINOPHEN LEVEL     Status: None   Collection Time    12/01/12  7:30 PM      Result Value Range   Acetaminophen (Tylenol), Serum <15.0  10 - 30 ug/mL   Comment:            THERAPEUTIC CONCENTRATIONS VARY     SIGNIFICANTLY. A RANGE OF 10-30     ug/mL MAY BE AN EFFECTIVE     CONCENTRATION FOR MANY PATIENTS.     HOWEVER, SOME ARE BEST TREATED     AT CONCENTRATIONS OUTSIDE THIS     RANGE.     ACETAMINOPHEN CONCENTRATIONS     >150 ug/mL AT 4 HOURS AFTER     INGESTION AND >50 ug/mL AT 12     HOURS AFTER INGESTION ARE     OFTEN ASSOCIATED WITH TOXIC     REACTIONS.  SALICYLATE LEVEL     Status: Abnormal   Collection Time    12/01/12  7:30 PM      Result Value Range   Salicylate Lvl <2.0 (*) 2.8 - 20.0 mg/dL    Physical Findings: AIMS: Facial and Oral Movements Muscles of Facial Expression: None, normal Lips and Perioral Area: None, normal Jaw: None, normal Tongue: None, normal,Extremity Movements Upper (arms, wrists, hands, fingers): None, normal Lower (legs, knees, ankles, toes): None, normal, Trunk Movements Neck, shoulders, hips: None, normal, Overall Severity Severity of abnormal movements (highest score from questions above): None, normal Incapacitation due to abnormal movements: None, normal Patient's awareness of abnormal movements (rate only patient's report): No Awareness, Dental Status Current problems with teeth and/or dentures?: No Does patient usually wear dentures?: No  CIWA:  CIWA-Ar Total: 6 COWS:  COWS Total Score:  3  Treatment Plan Summary: Daily contact with patient to assess and evaluate symptoms and progress in treatment Medication management  Plan: 1. Continue crisis management and stabilization. 2. Medication management to reduce current symptoms to base line and improve patient's overall level of functioning 3. Treat health problems as indicated. 4. Develop treatment plan to decrease risk of relapse upon discharge and the need for  readmission. 5. Psycho-social education regarding relapse prevention and self care. 6. Health care follow up as needed for medical problems. 7. Continue home medications where appropriate. 8/ ELOS: 2-3 days Medical Decision Making Problem Points:  Established problem, stable/improving (1) Data Points:  Review or order medicine tests (1)  I certify that inpatient services furnished can reasonably be expected to improve the patient's condition.  Rona Ravens. Zianne Schubring RPAC 9:35 PM 12/03/2012

## 2012-12-03 NOTE — [facility] Group Notes (Signed)
[facility] LCSW Group Therapy          Overcoming Obstacles       1:15 -2:30        12/03/2012   2:55 PM     Type of Therapy:  Group Therapy  Participation Level:  Minimal  Participation Quality:  Minimal  Affect:  Appropriate  Cognitive:  Attentive Appropriate  Insight: Developing/Improving   Engagement in Therapy: Developing/Improving  Modes of Intervention:  Discussion Exploration  Education Rapport BuildingProblem-Solving Support  Summary of Progress/Problems:  The main focus of today's group was overcoming  Obstacles.   Patient was attentive but did not participate in discussion.   Wynn Banker 12/03/2012    2:55 PM

## 2012-12-03 NOTE — Progress Notes (Signed)
Recreation Therapy Notes  Date: 09.22.2014 Time: 2:00pm Location: [address] Dayroom   Group Topic: Wellness  Goal Area(s) Addresses:  Patient will define components of whole wellness. Patient will verbalize benefit of whole wellness.  Behavioral Response: Did not attend  Jearl Klinefelter, LRT/CTRS  Jearl Klinefelter 12/03/2012 4:17 PM

## 2012-12-03 NOTE — Progress Notes (Signed)
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 Patient reports feeling a little better and has been observed up in the dayroom watching tv with peers. Patient seems to be more concerned with his missing work on Monday than his detox. Patient denies si/hi/a/v hallucinations, safety maintained on unit, support and encouragement offered. Will continue to monitor.

## 2012-12-03 NOTE — Progress Notes (Signed)
Adult Psychoeducational Group Note  Date:  12/03/2012 Time:  10:57 PM  Group Topic/Focus:  Goals Group:   The focus of this group is to help patients establish daily goals to achieve during treatment and discuss how the patient can incorporate goal setting into their daily lives to aide in recovery.  Participation Level:  Active  Participation Quality:  Appropriate  Affect:  Appropriate  Cognitive:  Appropriate  Insight: Appropriate  Engagement in Group:  Engaged  Modes of Intervention:  Discussion  Additional Comments:  PT stated that he had a good day and enjoyed laughing and talking with the others. The Dr also helped me out a lot today and is looking forward to leaving so he can get back to work.  Aldona Lento 12/03/2012, 10:57 PM

## 2012-12-03 NOTE — Tx Team (Signed)
  Interdisciplinary Treatment Plan Update   Date Reviewed:  12/03/2012  Time Reviewed:  5:04 PM  Progress in Treatment:   Attending groups: Yes Participating in groups: Yes Taking medication as prescribed: Yes  Tolerating medication: Yes Family/Significant other contact made: No  Patient understands diagnosis: Yes As evidenced by asking for help with alcohol dependence and withdrawal symptoms Discussing patient identified problems/goals with staff: Yes  See initial plan Medical problems stabilized or resolved: Yes Denies suicidal/homicidal ideation: Yes  In tx team Patient has not harmed self or others: Yes  For review of initial/current patient goals, please see plan of care.  Estimated Length of Stay:  2-3 days  Reason for Continuation of Hospitalization: Depression Medication stabilization Detox  New Problems/Goals identified:  N/A  Discharge Plan or Barriers:   unclear where he will land  Not interested in rehab or half way house  Gave him a list of extended stay hotels  Additional Comments:  :The patient presented to the [facility] requesting assistance with alcohol detox in order to save a job he has at [employer]. He is currently homeless and reports drinking 3 40oz beers a day for the past 2 weeks. He also notes that he has a history of withdrawal seizures and thinks that he had one Friday.   Attendees:  Signature:  12/03/2012 5:04 PM   Signature: Richelle Ito, LCSW 12/03/2012 5:04 PM  Signature: Verne Spurr  12/03/2012 5:04 PM  Signature: Quintella Reichert, RN 12/03/2012 5:04 PM  Signature: 12/03/2012 5:04 PM  Signature:  12/03/2012 5:04 PM  Signature:   12/03/2012 5:04 PM  Signature:    Signature:    Signature:    Signature:    Signature:    Signature:      Scribe for Treatment Team:   Richelle Ito, LCSW  12/03/2012 5:04 PM

## 2012-12-03 NOTE — Progress Notes (Signed)
Patient ID: Donald Orozco, male   DOB: [birth year], 47 y.o.   MRN: [id number] D: Pt. In dayroom interacting with others. Pt. Reports "I was self medicating after work it was to much for me" "they was drinking 40 ounces and [employer] and stuff like that" A: Writer introduced self to client and reviewed medications. Pt. Interested getting prescription for Librium and Nicotine samples. Pt. Instructed to speak to physician in am. Writer encouraged group. Staff will monitor q94min for safety. R: Pt. Is safe on the unit. Pt. Attended group.

## 2012-12-03 NOTE — Progress Notes (Addendum)
D:  Patient's self inventory sheet, patient has poor sleep, good appetite, low energy level, good attention span.  Rated depression #3, hopelessness #5, anxiety #4.  Has experienced tremors, chilling in past 24 hours.  Denied SI.  Has felt lightheaded, pain in past 24 hours.  Zero pain goal.  Bit his right side of gum while sleeping, gum is swollen and painful to chew.  Feels congested.  No discharge plans.  Needs financial assistance for medications after discharged. A:  Medications administered per MD orders.  Emotional support and encouragement given patient. R:  Patient denied SI and HI.  Denied A/V hallucinations.  Will continue to monitor patient for safety with 15 minute checks.  Safety maintained.  Patient stated he came to [facility]. With a girlfriend, then had to go shelter.  Unsure if he and girlfriend will be living together after his discharge from [facility].  Stated he also has sinus congestion, has been living outside since Labor Day.

## 2012-12-03 NOTE — Progress Notes (Signed)
Adult Psychoeducational Group Note  Date:  12/03/2012 Time:  11:45 AM  Group Topic/Focus:  Developing a Wellness Toolbox:   The focus of this group is to help patients develop a "wellness toolbox" with skills and strategies to promote recovery upon discharge.  Participation Level:  Did Not Attend  Donald Orozco 12/03/2012, 11:45 AM

## 2012-12-04 NOTE — [facility] Group Notes (Signed)
[facility] LCSW Group Therapy  12/04/2012  1:15 PM   Type of Therapy:  Group Therapy  Participation Level:  Minimal  Participation Quality:  Minimal  Affect:  Appropriate and Calm  Cognitive:  Distracted  Insight:  Developing/Improving and Engaged  Engagement in Therapy:  Developing/Improving and Engaged  Modes of Intervention:  Clarification, Confrontation, Discussion, Education, Exploration, Limit-setting, Orientation, Problem-solving, Rapport Building, Dance movement psychotherapist, Socialization and Support  Summary of Progress/Problems: The topic for group therapy was feelings about diagnosis.  Pt actively participated in group discussion on their past and current diagnosis and how they feel towards this.  Pt also identified how society and family members judge them, based on their diagnosis as well as stereotypes and stigmas.   Pt appeared distracted for the duration of group, looking out the window and getting up to get coffee multiple times.  Pt shared but was off topic, in sharing what brought him to the hospital and planning to join a sober bike club when he discharges.    Donald Orozco, [place] 12/04/2012 2:32 PM

## 2012-12-04 NOTE — Progress Notes (Signed)
Adult Psychoeducational Group Note  Date:  12/04/2012 Time:  10:45 PM  Group Topic/Focus:  Goals Group:   The focus of this group is to help patients establish daily goals to achieve during treatment and discuss how the patient can incorporate goal setting into their daily lives to aide in recovery.  Participation Level:  Active  Participation Quality:  Appropriate  Affect:  Appropriate  Cognitive:  Appropriate  Insight: Appropriate  Engagement in Group:  Engaged  Modes of Intervention:  Discussion  Additional Comments:  Pt stated that he was excited about leaving on Thursday. Pt also talked about the Dr, Meds, and placement [facility] to assist him when he leaves.  Terie Purser R 12/04/2012, 10:45 PM

## 2012-12-04 NOTE — [facility] Suicide Risk Assessment (Signed)
[facility] INPATIENT: Family/Significant Other Suicide Prevention Education  Suicide Prevention Education:  Education Completed; No one has been identified by the patient as the family member/significant other with whom the patient will be residing, and identified as the person(s) who will aid the patient in the event of a mental health crisis (suicidal ideations/suicide attempt). With written consent from the patient, the family member/significant other has been provided the following suicide prevention education, prior to the and/or following the discharge of the patient.  The suicide prevention education provided includes the following:  Suicide risk factors  Suicide prevention and interventions  National Suicide Hotline telephone number  [facility] assessment telephone number  [facility] Emergency Assistance 911  [facility] and/or Residential Mobile Crisis Unit telephone number Request made of family/significant other to:  Remove weapons (e.g., guns, rifles, knives), all items previously/currently identified as safety concern.  Remove drugs/medications (over-the-counter, prescriptions, illicit drugs), all items previously/currently identified as a safety concern. The family member/significant other verbalizes understanding of the suicide prevention education information provided. The family member/significant other agrees to remove the items of safety concern listed above. Pt did not c/o SI at admission, nor have they endorsed SI during their stay here. SPE not required.  Donald Orozco, LCSWA 12/04/2012  9:29 AM

## 2012-12-04 NOTE — Progress Notes (Signed)
Recreation Therapy Notes  Date: 09.23.2014 Time: 2:45pm Location: 500 [address] Dayroom  Group Topic: Animal Assisted Activities (AAA)  Behavioral Response: Appropriate  Affect: Flat  Clinical Observations/Feedback: Dog Team: [employer]. Patient interacted appropriately with peer, dog team, LRT and MHT.   Marykay Lex Shanekia Latella, LRT/CTRS  Jearl Klinefelter 12/04/2012 8:54 PM

## 2012-12-04 NOTE — Progress Notes (Signed)
D:  Patient's self inventory sheet, patient has fair sleep, good appetite, normal energy level, improving attention span.  Denied depression.  Rated hopelessness and anxiety #7.  Stated he has experienced chilling and cravings in past 24 hours.  Denied SI.  Needs smoking patches after discharge.  Still experiencing mouth pain put on form, but denied pain with nurse.  Worst pain #4.  No home, homeless.  Needs cig patches, sinus medication saline solution, light antibiotics.  After to work after discharge.  May need financial assistance with medications after discharge. A:  Medications administered per MD orders.  Emotional support and encouragement given patient. R:  Denied SI and HI.  Denied A/V hallucinations.  Denied pain.  Will continue to monitor patient for safety with 15 minute checks.  Safety maintained.

## 2012-12-04 NOTE — Progress Notes (Signed)
Patient ID: Donald Orozco, male   DOB: 08-Nov-1965, 47 y.o.   MRN: [id number] [facility] MD Progress Note  12/04/2012 1:48 PM Donald Orozco  MRN:  [id number]  Subjective:  Patient has been compliant with his detox treatment program and has been tolerating without significant withdrawal symptoms. Patient is still having fine tremors, and started thinking about going back to work after discharge. He has request free sample of nasal spray, nicotine patches and oral antibiotics for sniffling when ready to be discharged. He has tangential and circumstantial speech. He has goal directed activity. Reportedly he was max out his benefits at [facility] and needs a placement.   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 rate 18, height 5\' 9"  (1.753 m), weight 77.565 kg (171 lb).Body mass index is 25.24 kg/(m^2).  General Appearance: Disheveled  Eye Solicitor::  Fair  Speech:  Pressured  Volume:  Normal  Mood:  Grandiose and self important  Affect:  congruent  Thought Process:  Circumstantial  Orientation:  Full (Time, Place, and Person)  Thought Content:  WDL  Suicidal Thoughts:  No  Homicidal Thoughts:  No  Memory:  Immediate;   Fair  Judgement:  Poor  Insight:  Shallow  Psychomotor Activity:  Restlessness and Tremor  Concentration:  Poor  Recall:  Fair  Akathisia:  No  Handed:  Right  AIMS (if indicated):     Assets:  Desire for Improvement  Sleep:  Number of Hours: 4.75    Current Medications: Current Facility-Administered Medications  Medication Dose Route Frequency Provider Last Rate Last Dose  . acetaminophen (TYLENOL) tablet 650 mg  650 mg Oral Q6H PRN Evanna Janann August, NP      . alum & mag hydroxide-simeth (MAALOX/MYLANTA) 200-200-20 MG/5ML suspension 30 mL  30 mL Oral Q4H PRN Evanna Janann August, NP      . benzocaine (ORAJEL) 10 % mucosal gel   Mouth/Throat QID PRN Verne Spurr, PA-C      . carbamazepine (TEGRETOL) tablet 200 mg  200 mg Oral TID Verne Spurr, PA-C   200 mg at 12/04/12 1152  . chlordiazePOXIDE (LIBRIUM) capsule 25 mg  25 mg Oral Q6H PRN Verne Spurr, PA-C      . chlordiazePOXIDE (LIBRIUM) capsule 25 mg  25 mg Oral TID Verne Spurr, PA-C   25 mg at 12/04/12 1151   Followed by  . [START ON 12/05/2012] chlordiazePOXIDE (LIBRIUM) capsule 25 mg  25 mg Oral BH-qamhs Verne Spurr, PA-C       Followed by  . [START ON 12/06/2012] chlordiazePOXIDE (LIBRIUM) capsule 25 mg  25 mg Oral Daily Verne Spurr, PA-C      . folic acid (FOLVITE) tablet 1 mg  1 mg Oral Daily Evanna Janann August, NP   1 mg at 12/04/12 0749  . loperamide (IMODIUM) capsule 2-4 mg  2-4 mg Oral PRN Verne Spurr, PA-C      . magnesium hydroxide (MILK OF MAGNESIA) suspension 30 mL  30 mL Oral Daily PRN Evanna Janann August, NP      . multivitamin with  minerals tablet 1 tablet  1 tablet Oral Daily Evanna Janann August, NP   1 tablet at 12/04/12 0750  . nicotine (NICODERM CQ - dosed in mg/24 hours) patch 21 mg  21 mg Transdermal Q0600 Nehemiah Settle, MD   21 mg at 12/02/12 2006  . ondansetron (ZOFRAN-ODT) disintegrating tablet 4 mg  4 mg Oral Q6H PRN Verne Spurr, PA-C      . sodium chloride (OCEAN) 0.65 % nasal spray 1 spray  1 spray Each Nare PRN Verne Spurr, PA-C   1 spray at 12/04/12 0752  . thiamine (VITAMIN B-1) tablet 100 mg  100 mg Oral Daily Evanna Cori Merry Proud, NP   100 mg at 12/04/12 0750  . traZODone (DESYREL) tablet 100 mg  100 mg Oral QHS PRN Audrea Muscat, NP   100 mg at 12/03/12 2143    Lab Results:  No results found for this or any previous visit (from the past 48 hour(s)).  Physical Findings: AIMS: Facial and Oral Movements Muscles of Facial Expression: None, normal Lips and Perioral Area: None, normal Jaw: None, normal Tongue: None, normal,Extremity Movements Upper (arms, wrists, hands, fingers): None, normal Lower (legs, knees, ankles, toes): None, normal, Trunk Movements Neck, shoulders, hips: None, normal, Overall Severity Severity of abnormal movements (highest score from questions above): None, normal Incapacitation due to abnormal movements: None, normal Patient's awareness of abnormal movements (rate only patient's report): No Awareness, Dental Status Current problems with teeth and/or dentures?: No Does patient usually wear dentures?: No  CIWA:  CIWA-Ar Total: 1 COWS:  COWS Total Score: 3  Treatment Plan Summary: Daily contact with patient to assess and evaluate symptoms and progress in treatment Medication management  Plan: 1. Continue crisis management and stabilization. 2. Medication management to reduce current symptoms to base line and improve patient's overall level of functioning; continue detox treatment and other medications scheduled 3. Treat health problems as indicated. 4. Develop treatment plan to decrease risk of relapse upon discharge and the need for     readmission. 5. Psycho-social education regarding relapse prevention and self care. 6. Health care follow up as needed for medical problems. 7. Continue home medications where appropriate. 8. ELOS: 1-2 days  Medical Decision Making Problem Points:  Established problem, stable/improving (1) Data Points:  Review or order medicine tests (1)  I certify that inpatient services furnished can reasonably be expected to improve the patient's condition.   Crosley Stejskal,JANARDHAHA R. 12/04/2012 1:49 PM

## 2012-12-04 NOTE — BHH Counselor (Signed)
Adult Comprehensive Assessment  Patient ID: Donald Orozco, male   DOB: 08-31-1965, 47 y.o.   MRN: [id number]  Information Source: Information source: Patient  Current Stressors:  Educational / Learning stressors: N/A Employment / Job issues: employed Family Relationships: No family support Surveyor, quantity / Lack of resources (include bankruptcy): finances are tight Housing / Lack of housing: homeless Physical health (include injuries & life threatening diseases): N/A Social relationships: N/A Substance abuse: Alcohol abuse Bereavement / Loss: N/A  Living/Environment/Situation:  Living Arrangements: Alone;Other (Comment) Living conditions (as described by patient or guardian): Pt states that he is currently homeless and stays from here to there in [place].  Pt was at the [facility] prior but is unable to return back due to staying as long as he could.   How long has patient lived in current situation?: 3 weeks What is atmosphere in current home: Temporary  Family History:  Marital status: Single Does patient have children?: No  Childhood History:  By whom was/is the patient raised?: Both parents Additional childhood history information: Pt states that he was brought up in an upper middle class and had a decent childhood but had to grow up early.   Description of patient's relationship with caregiver when they were a child: Pt states that he got along well with parents growing up.  Patient's description of current relationship with people who raised him/her: Pt states that he has a distant relationship with mother.  Father is deceased.   Does patient have siblings?: Yes Number of Siblings: 1 Description of patient's current relationship with siblings: No contact with brother today - haven't seen brother since pt was 60 yrs old.  Did patient suffer any verbal/emotional/physical/sexual abuse as a child?: No Did patient suffer from severe childhood neglect?: No Has patient ever been  sexually abused/assaulted/raped as an adolescent or adult?: No Was the patient ever a victim of a crime or a disaster?: No Witnessed domestic violence?: No Has patient been effected by domestic violence as an adult?: No  Education:  Highest grade of school patient has completed: 10th Currently a student?: No Learning disability?: Yes What learning problems does patient have?: ADHD  Employment/Work Situation:   Employment situation: Employed 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  If attempted suicide, did drugs/alcohol play a role in this?: No Alcohol/Substance Abuse Treatment Hx: Past detox;Attends AA/NA If yes, describe treatment: Detox 5 months ago in [facility] Has alcohol/substance abuse ever caused legal problems?: Yes (DUI in 1996, 1998, 7 yrs ago)  Social Support System:   Forensic psychologist System: None Describe Community Support System: Pt denies having any support Type of faith/religion: Christian How does patient's faith help to cope with current illness?: prayer, church attendance  Leisure/Recreation:   Leisure and Hobbies: "drinking", likes to fish and go to 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", Chief Executive Officer  In what areas does patient struggle / problems for patient: Alcohol abuse/detox  Discharge Plan:   Does patient have  access to transportation?: Yes Will patient be returning to same living situation after discharge?: Yes Currently receiving community mental health services: No If no, would patient like referral for services when discharged?: Yes (What county?) [facility]) Does patient have financial barriers related to discharge medications?: No  Summary/Recommendations:     Patient is a 47 year old Caucasian Male with a diagnosis of Alcohol Dependence.  Patient is homeless in [place].  Pt states that he wants to detox off of alcohol.  Patient will benefit from crisis stabilization, medication evaluation, group therapy and psycho education in addition to case management for discharge planning.    [name], [name]. 12/04/2012

## 2012-12-04 NOTE — Progress Notes (Signed)
The focus of this group is to educate the patient on the purpose and policies of crisis stabilization and provide a format to answer questions about their admission.  The group details unit policies and expectations of patients while admitted.  Patient attended 0900 nurse education orientation group.  Patient listened, appropriate affect, alert, appropriate insight and engagement.  Patient will work on goals for discharge today.

## 2012-12-04 NOTE — Progress Notes (Signed)
Adult Psychoeducational Group Note  Date:  12/04/2012 Time:  11:00am Group Topic/Focus:  Recovery Goals:   The focus of this group is to identify appropriate goals for recovery and establish a plan to achieve them.  Participation Level:  Active  Participation Quality:  Appropriate and Drowsy  Affect:  Appropriate  Cognitive:  Appropriate  Insight: Appropriate  Engagement in Group:  Engaged  Modes of Intervention:  Discussion and Education  Additional Comments:  Pt attended and participated in group. Pt was drowsy during group falling asleep periodically. When ask what recovery means to him pt stated changing behavior of the past not drinking 4 40 oz beers with a friend after work and taking meds to put him out so he can sleep or going 4 days without sleep so he could be so tired he would sleep. When ask what he could do to help himself in the recovery process pt stated not hanging out with the friends that drink, get help, change hsi diet and stop self medicating himself.  Shelly Bombard D 12/04/2012, 1:11 PM

## 2012-12-04 NOTE — Progress Notes (Signed)
Patient ID: Donald Orozco, male   DOB: 14-Jul-1965, 47 y.o.   MRN: [id number]  D: Pt denies SI/HI/AVH. Pt is pleasant and cooperative. Pt concerned about medications and samples when hen leaves.   A: Pt was offered support and encouragement. Pt was given scheduled medications. Pt was encourage to attend groups. Q 15 minute checks were done for safety.   R:Pt  interacts well with peers and staff. Pt is taking medication.Pt receptive to treatment and safety maintained on unit.

## 2012-12-05 LAB — GLUCOSE, CAPILLARY: Glucose-Capillary: 123 mg/dL — ABNORMAL HIGH (ref 70–99)

## 2012-12-05 MED ORDER — CARBAMAZEPINE 200 MG PO TABS: 200.0000 mg | ORAL_TABLET | Freq: Three times a day (TID) | ORAL | Status: DC

## 2012-12-05 MED ORDER — TRAZODONE HCL 100 MG PO TABS: 100.0000 mg | ORAL_TABLET | Freq: Every evening | ORAL | Status: DC | PRN

## 2012-12-05 NOTE — Progress Notes (Signed)
Discharge Note: Discharge instructions/prescriptions/medication samples and bus pass given to patient. Patient verbalized understanding of discharge instructions and prescriptions. Returned belongings to patient. Denies SI/HI/AVH. Patient d/c without incident to the front lobby and transported by [employer] bus.

## 2012-12-05 NOTE — Tx Team (Signed)
Interdisciplinary Treatment Plan Update (Adult)  Date: 12/05/2012  Time Reviewed:  9:45 AM  Progress in Treatment: Attending groups: Yes Participating in groups:  Yes Taking medication as prescribed:  Yes Tolerating medication:  Yes Family/Significant othe contact made: N/A Patient understands diagnosis:  Yes Discussing patient identified problems/goals with staff:  Yes Medical problems stabilized or resolved:  Yes Denies suicidal/homicidal ideation: Yes Issues/concerns per patient self-inventory:  Yes Other:  New problem(s) identified: N/A  Discharge Plan or Barriers: Pt will follow up at [facility] for medication management and therapy.    Reason for Continuation of Hospitalization: Stable to d/c  Comments: N/A  Estimated length of stay: D/C today  For review of initial/current patient goals, please see plan of care.  Attendees: Patient:  Donald Orozco  12/05/2012 10:44 AM   Family:     Physician:  Dr. Javier Glazier 12/05/2012 10:40 AM   Nursing:   Lowella Grip, RN 12/05/2012 10:40 AM   Clinical Social Worker:  Reyes Ivan, LCSWA 12/05/2012 10:40 AM   Other: Verne Spurr, PA 12/05/2012 10:40 AM   Other:  Frankey Shown, MA care coordination 12/05/2012 10:40 AM   Other:  Nestor Ramp, RN 12/05/2012 10:40 AM   Other:  Onnie Boer, RN 12/05/2012 10:41 AM   Other:    Other:    Other:    Other:    Other:    Other:     Scribe for Treatment Team:   Carmina Miller, 12/05/2012 10:40 AM

## 2012-12-05 NOTE — Progress Notes (Signed)
[facility] Adult Case Management Discharge Plan :  Will you be returning to the same living situation after discharge: Yes,  pt was homeless prior to admission, will continue to be homeless upon d/c At discharge, do you have transportation home?:Yes,  provided pt with a bus pass Do you have the ability to pay for your medications:Yes,  access to meds  Release of information consent forms completed and in the chart;  Patient's signature needed at discharge.  Patient to Follow up at: Follow-up Information   Follow up with [facility] On 12/06/2012. (Walk in on this date for hospital discharge appointment, for medication management and therapy.  Walk in clinic is Monday - Friday 8 am - 4 pm)    Contact information:   [address] [place], [place] [address] Phone: [phone] Fax: [phone]      Patient denies SI/HI:   Yes,  denies SI/HI    Safety Planning and Suicide Prevention discussed:  Yes,  discussed with pt.  N/A to contact family or friend due to no SI on admission.  Pt is already linked with Cathleen Fears, homeless liaison and is familiar with resources available in the community.  Pt states that [facility].    Carmina Miller 12/05/2012, 10:50 AM

## 2012-12-05 NOTE — Discharge Summary (Signed)
Physician Discharge Summary Note  Patient:  Donald Orozco is an 47 y.o., male MRN:  [id number] DOB:  [birth year] Patient phone:  [phone] (home)  Patient address:   [address] [place] [address],   Date of Admission:  12/01/2012 Date of Discharge: 12/05/2012  Reason for Admission:  Alcohol detox                                          Bipolar disorder  Discharge Diagnoses: Principal Problem:   Alcohol withdrawal seizure without complication Active Problems:   Bipolar I disorder, most recent episode (or current) manic  ROS  DSM5: DSM5:  Schizophrenia Disorders:  Obsessive-Compulsive Disorders:  Trauma-Stressor Disorders:  Substance/Addictive Disorders: Alcohol Intoxication with Use Disorder - Severe (F10.229)  Depressive Disorders: Bipolar disorder most recent episode manic vs. SIMDO  AXIS I: Bipolar, Manic  AXIS II: Deferred  AXIS III:  Past Medical History   Diagnosis  Date   .  Bipolar 1 disorder     AXIS IV: occupational problems, problems related to social environment, problems with access to health care services and problems with primary support group  AXIS V: 41-50 serious symptoms  Level of Care:  OP  Hospital Course:  Donald Orozco was admitted after he presented to the [facility] reporting his need for assistance with detox due to his history of withdrawal seizures and bipolar disorder. He stated that [place] and he has been off of it for 3 months. 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 He felt the only way to do this was to be detoxed and to get back on his bipolar medication.            Donald Orozco was oriented to the unit and encouraged to participate in unit programming. Medical problems were identified and treated appropriately. Home medication was restarted as needed. Psychiatric medication management was initiated.        The patient was evaluated each day by a  clinical provider to ascertain the patient's response to treatment.  Improvement was noted by the patient's report of decreasing symptoms, improved sleep and appetite, affect, medication tolerance, behavior, and participation in unit programming.  [name] was asked each day to complete a self inventory noting mood, mental status, pain, new symptoms, anxiety and concerns.         He responded well to medication and being in a therapeutic and supportive environment. Positive and appropriate behavior was noted and the patient was motivated for recovery. Donald Orozco worked closely with the treatment team and case manager to develop a discharge plan with appropriate goals. Coping skills, problem solving as well as relaxation therapies were also part of the unit programming.         By the day of discharge the patient was in much improved condition than upon admission.  Symptoms were reported as significantly decreased or resolved completely.  The patient denied SI/HI and voiced no AVH. He was motivated to continue taking medication with a goal of continued improvement in mental health.  He was discharged home with a plan to follow up as noted below.  Consults:  None  Significant Diagnostic Studies:  None  Discharge Vitals:   Blood pressure 136/90, pulse 84, temperature 97.5 F (36.4 C), temperature source Oral, resp. rate 18,  height 5\' 9"  (1.753 m), weight 77.565 kg (171 lb). Body mass index is 25.24 kg/(m^2). Lab Results:   No results found for this or any previous visit (from the past 72 hour(s)).  Physical Findings: AIMS: Facial and Oral Movements Muscles of Facial Expression: None, normal Lips and Perioral Area: None, normal Jaw: None, normal Tongue: None, normal,Extremity Movements Upper (arms, wrists, hands, fingers): None, normal Lower (legs, knees, ankles, toes): None, normal, Trunk Movements Neck, shoulders, hips: None, normal, Overall Severity Severity of abnormal movements (highest score from  questions above): None, normal Incapacitation due to abnormal movements: None, normal Patient's awareness of abnormal movements (rate only patient's report): No Awareness, Dental Status Current problems with teeth and/or dentures?: No Does patient usually wear dentures?: No  CIWA:  CIWA-Ar Total: 0 COWS:  COWS Total Score: 2  Psychiatric Specialty Exam: See Psychiatric Specialty Exam and Suicide Risk Assessment completed by Attending Physician prior to discharge.  Discharge destination:  Home  Is patient on multiple antipsychotic therapies at discharge:  No   Has Patient had three or more failed trials of antipsychotic monotherapy by history:  No  Recommended Plan for Multiple Antipsychotic Therapies: NA  Discharge Orders   Future Orders Complete By Expires   Diet - low sodium heart healthy  As directed    Discharge instructions  As directed    Comments:     Take all of your medications as directed. Be sure to keep all of your follow up appointments.  If you are unable to keep your follow up appointment, call your Doctor's office to let them know, and reschedule.  Make sure that you have enough medication to last until your appointment. Be sure to get plenty of rest. Going to bed at the same time each night will help. Try to avoid sleeping during the day.  Increase your activity as tolerated. Regular exercise will help you to sleep better and improve your mental health. Eating a heart healthy diet is recommended. Try to avoid salty or fried foods. Be sure to avoid all alcohol and illegal drugs.   Increase activity slowly  As directed        Medication List       Indication   carbamazepine 200 MG tablet  Commonly known as:  TEGRETOL  Take 1 tablet (200 mg total) by mouth 3 (three) times daily. For mood stabilization.   Indication:  Manic-Depression     traZODone 100 MG tablet  Commonly known as:  DESYREL  Take 1 tablet (100 mg total) by mouth at bedtime as needed for sleep.    Indication:  Trouble Sleeping           Follow-up Information   Follow up with [facility] On 12/06/2012. (Walk in on this date for hospital discharge appointment, for medication management and therapy.  Walk in clinic is Monday - Friday 8 am - 4 pm)    Contact information:   [address] [place], [place] [address] Phone: [phone] Fax: [phone]      Follow-up recommendations:   Activities: Resume activity as tolerated. Diet: Heart healthy low sodium diet Tests: Follow up testing will be determined by your out patient provider. Comments:    Total Discharge Time:  Greater than 30 minutes.  Signed: Rona Ravens. Mashburn RPAC 11:00 AM 12/05/2012  Patient was personally evaluated, completed suicide risk assessment, case discussed with treatment team and physician extender. Treatment plan developed. Reviewed the information documented and agree with the treatment plan.  Leniya Breit,JANARDHAHA R.  12/05/2012 8:05 PM

## 2012-12-05 NOTE — BHH Group Notes (Signed)
[facility] LCSW Aftercare Discharge Planning Group Note   12/05/2012 8:45 AM  Participation Quality:  Alert and Appropriate   Mood/Affect:  Appropriate  Depression Rating:  0  Anxiety Rating:  0  Thoughts of Suicide:  Pt denies SI/HI  Will you contract for safety?   Yes  Current AVH:  Pt denies  Plan for Discharge/Comments:  Pt attended discharge planning group and actively participated in group.  CSW provided pt with today's workbook.  Pt reports feeling stable to d/c today.  Pt will return to [facility] and is homeless.  Pt verbalizes understanding resources available in the community.  Pt will follow up at [facility] for medication management and therapy.  No further needs voiced by pt at this time.    Transportation Means: Pt reports access to transportation - provided pt a bus pass  Supports: No supports mentioned at this time  Reyes Ivan, LCSWA 12/05/2012 9:46 AM

## 2012-12-05 NOTE — Progress Notes (Signed)
Adult Psychoeducational Group Note  Date:  12/05/2012 Time:  10:00 11:00 groups Group Topic/Focus:  Personal Choices and Values:   The focus of this group is to help patients assess and explore the importance of values in their lives, how their values affect their decisions, how they express their values and what opposes their expression.  Participation Level:  Active  Participation Quality:  Appropriate and Attentive  Affect:  Appropriate  Cognitive:  Alert and Appropriate  Insight: Appropriate  Engagement in Group:  Engaged  Modes of Intervention:  Discussion and Education  Additional Comments:  Pt attended and participated in groups. We talked about personal development and loving yourself. Pt expressed things that he would like to change about his life. Those things were getting his bike back, getting a debit card and having his own house.  Shelly Bombard D 12/05/2012, 11:22 AM

## 2012-12-05 NOTE — Progress Notes (Signed)
Agree with assessment and plan Jonthan Leite A. Ryland Smoots, M.D. 

## 2012-12-05 NOTE — BHH Suicide Risk Assessment (Signed)
Suicide Risk Assessment  Discharge Assessment     Demographic Factors:  Male, Adolescent or young adult, Caucasian, Low socioeconomic status and Unemployed  Mental Status Per Nursing Assessment::   On Admission:  NA  Current Mental Status by Physician: Mental Status Examination: Patient appeared as per his stated age, casually dressed, and fairly groomed, and maintaining good eye contact. Patient has good mood and his affect was constricted. He has normal rate, rhythm, and volume of speech. His thought process is linear and goal directed. Patient has denied suicidal, homicidal ideations, intentions or plans. Patient has no evidence of auditory or visual hallucinations, delusions, and paranoia. Patient has fair insight judgment and impulse control.  Loss Factors: Financial problems/change in socioeconomic status  Historical Factors: Family history of mental illness or substance abuse and Impulsivity  Risk Reduction Factors:   Sense of responsibility to family, Religious beliefs about death, Positive social support and Positive therapeutic relationship  Continued Clinical Symptoms:  Bipolar Disorder:   Mixed State Alcohol/Substance Abuse/Dependencies  Cognitive Features That Contribute To Risk:  Polarized thinking    Suicide Risk:  Minimal: No identifiable suicidal ideation.  Patients presenting with no risk factors but with morbid ruminations; may be classified as minimal risk based on the severity of the depressive symptoms  Discharge Diagnoses:   AXIS I:  Bipolar, mixed and Status post Alcohol intoxication, alcohol dependence AXIS II:  Deferred AXIS III:   Past Medical History  Diagnosis Date  . Bipolar 1 disorder    AXIS IV:  economic problems, housing problems, occupational problems, other psychosocial or environmental problems, problems related to social environment and problems with primary support group AXIS V:  51-60 moderate symptoms  Plan Of Care/Follow-up  recommendations:  Activity:  As tolerated Diet:  Regular  Is patient on multiple antipsychotic therapies at discharge:  No   Has Patient had three or more failed trials of antipsychotic monotherapy by history:  No  Recommended Plan for Multiple Antipsychotic Therapies: NA  Nehemiah Settle., M.D. 12/05/2012, 11:52 AM

## 2012-12-07 ENCOUNTER — Encounter (HOSPITAL_COMMUNITY): Payer: Self-pay | Admitting: Emergency Medicine

## 2012-12-07 ENCOUNTER — Emergency Department (HOSPITAL_COMMUNITY): Payer: Self-pay

## 2012-12-07 ENCOUNTER — Emergency Department (HOSPITAL_COMMUNITY)
Admission: EM | Admit: 2012-12-07 | Discharge: 2012-12-07 | Disposition: A | Discharge status: Home / Self Care | Payer: No Typology Code available for payment source | Attending: Emergency Medicine | Admitting: Emergency Medicine

## 2012-12-07 DIAGNOSIS — F4321 Adjustment disorder with depressed mood: Secondary | ICD-10-CM

## 2012-12-07 DIAGNOSIS — J189 Pneumonia, unspecified organism: Secondary | ICD-10-CM | POA: Insufficient documentation

## 2012-12-07 DIAGNOSIS — R911 Solitary pulmonary nodule: Secondary | ICD-10-CM | POA: Insufficient documentation

## 2012-12-07 DIAGNOSIS — F10929 Alcohol use, unspecified with intoxication, unspecified: Secondary | ICD-10-CM

## 2012-12-07 DIAGNOSIS — F22 Delusional disorders: Secondary | ICD-10-CM | POA: Insufficient documentation

## 2012-12-07 DIAGNOSIS — F319 Bipolar disorder, unspecified: Secondary | ICD-10-CM | POA: Insufficient documentation

## 2012-12-07 DIAGNOSIS — F1020 Alcohol dependence, uncomplicated: Secondary | ICD-10-CM

## 2012-12-07 DIAGNOSIS — F17200 Nicotine dependence, unspecified, uncomplicated: Secondary | ICD-10-CM | POA: Insufficient documentation

## 2012-12-07 DIAGNOSIS — R443 Hallucinations, unspecified: Secondary | ICD-10-CM | POA: Insufficient documentation

## 2012-12-07 DIAGNOSIS — R000 Tachycardia, unspecified: Secondary | ICD-10-CM | POA: Insufficient documentation

## 2012-12-07 DIAGNOSIS — F1010 Alcohol abuse, uncomplicated: Secondary | ICD-10-CM | POA: Insufficient documentation

## 2012-12-07 DIAGNOSIS — Z79899 Other long term (current) drug therapy: Secondary | ICD-10-CM | POA: Insufficient documentation

## 2012-12-07 LAB — CBC WITH DIFFERENTIAL/PLATELET
Basophils Absolute: 0.1 10*3/uL (ref 0.0–0.1)
Basophils Relative: 1 % (ref 0–1)
Lymphocytes Relative: 35 % (ref 12–46)
Lymphs Abs: 3.3 10*3/uL (ref 0.7–4.0)
MCHC: 33.7 g/dL (ref 30.0–36.0)
MCV: 95.6 fL (ref 78.0–100.0)
Monocytes Absolute: 0.6 10*3/uL (ref 0.1–1.0)
Neutro Abs: 5.5 10*3/uL (ref 1.7–7.7)
Neutrophils Relative %: 57 % (ref 43–77)
Platelets: 269 10*3/uL (ref 150–400)
RBC: 4.53 MIL/uL (ref 4.22–5.81)
WBC: 9.6 10*3/uL (ref 4.0–10.5)

## 2012-12-07 LAB — COMPREHENSIVE METABOLIC PANEL
ALT: 23 U/L (ref 0–53)
AST: 29 U/L (ref 0–37)
Albumin: 3.7 g/dL (ref 3.5–5.2)
Alkaline Phosphatase: 53 U/L (ref 39–117)
CO2: 24 mEq/L (ref 19–32)
Chloride: 104 mEq/L (ref 96–112)
GFR calc Af Amer: >90 mL/min (ref >90)
GFR calc non Af Amer: >90 mL/min (ref >90)
Glucose, Bld: 80 mg/dL (ref 70–99)
Potassium: 3.9 mEq/L (ref 3.5–5.1)
Sodium: 141 mEq/L (ref 135–145)
Total Protein: 7.1 g/dL (ref 6.0–8.3)

## 2012-12-07 LAB — RAPID URINE DRUG SCREEN, HOSP PERFORMED
Amphetamines: NOT DETECTED
Barbiturates: NOT DETECTED
Tetrahydrocannabinol: NOT DETECTED

## 2012-12-07 MED ORDER — SODIUM CHLORIDE 0.9 % IV SOLN
Freq: Once | INTRAVENOUS | Status: AC
  Administered 2012-12-07: 03:00:00 via INTRAVENOUS

## 2012-12-07 MED ORDER — CEFTRIAXONE SODIUM 1 G IJ SOLR
1.0000 g | Freq: Once | INTRAMUSCULAR | Status: AC
  Administered 2012-12-07: 1 g via INTRAVENOUS
  Filled 2012-12-07: qty 10

## 2012-12-07 MED ORDER — AZITHROMYCIN 250 MG PO TABS
500.0000 mg | ORAL_TABLET | Freq: Once | ORAL | Status: AC
  Administered 2012-12-07: 500 mg via ORAL
  Filled 2012-12-07: qty 2

## 2012-12-07 MED ORDER — AZITHROMYCIN 250 MG PO TABS: ORAL_TABLET | ORAL | Status: DC

## 2012-12-07 NOTE — ED Notes (Signed)
Pt belongings: necklass, jeans, belts, long sleeve shirt, black bag with other clothing and boots.

## 2012-12-07 NOTE — ED Notes (Signed)
Psych MD at bedside

## 2012-12-07 NOTE — ED Provider Notes (Signed)
Medical screening examination/treatment/procedure(s) were conducted as a shared visit with non-physician practitioner(s) and myself.  I personally evaluated the patient during the encounter  Patient feels as if he is sobering up and now, he has amnesia for the events of last night other than recalling drinking alcohol, he denies any threats to harm himself or others, denies suicidal or homicidal ideation, denies hallucinations, he has a slight cough in the ED but states he has not been coughing recently, he is no fever, he is no chest pain or shortness of breath, denies abdominal pain neck pain back pain focal weakness numbness or other concerns, he has been given antibiotics for questionable pneumonia on his chest x-ray, he states he is homeless and never pays attention to the base of the week for months of the year, he states he still cannot quite walk independently at after drinking heavily last night, he states he does not quite feel ready for discharge yet because his gait is not quite back to normal. He does not feel like he is in withdrawal and is not interested in detox at this time, he states he was recently here for detox and started drinking again after discharge.  On examination he is awake alert oriented to person and place, he states he is homeless and never oriented to time, he is unsure if today is Friday or Saturday and today is Friday, he states is typical for him, pupils reactive extraocular movements intact peripheral visual fields full to confrontation no facial asymmetry cervical spine nontender back nontender lungs clear to auscultation unlabored no wheezes rales retractions rhonchi or accessory muscle usage, cardiac regular rate and rhythm without audible murmur, abdomen soft nontender with bowel sounds present, extremities nontender good range of motion, motor strength 5 out of 5 in all 4 extremities normal light touch in all 4 extremities normal bilateral finger to nose testing, gait is  still slightly ataxic and patient requires minimal assistance with gait at 0820  Pt feels ready for discharge, RN states Pt walking well. 1110  Hurman Horn, MD 12/08/12 [phone]

## 2012-12-07 NOTE — ED Notes (Signed)
Pt escorted to discharge window. Verbalized understanding discharge instructions. In no acute distress.  Pt given a bus pass.  

## 2012-12-07 NOTE — ED Notes (Signed)
Per EMS pt is very intoxicated and thinks he was a test subject for experimental medication OC3  Pt also states he is a [place] pig and wants a bagel with cream cheese

## 2012-12-07 NOTE — Progress Notes (Signed)
[facility] provided pt with a [employer] Orange Card application, highlighting [facility].  °

## 2012-12-07 NOTE — ED Provider Notes (Signed)
Patient care assumed from Earley Favor, FNP at shift change  Patient presents to ED intoxicated; hx of bipolar d/o. Patient believes he is a test subject for experimental medications. Patient found to have CAP today on CXR. Tx in ED with IV Rocephin and PO azithromycin. Hemodynamically stable and afebrile. EtOH 188 on arrival. Plan to d/c with PO abx for CAP once sober.  Patient seen and evaluated by Dr. Wayland Salinas. Patient now clinically sober. Appropriate for d/c with appropriate return precautions.  Antony Madura, PA-C 12/07/12 1112

## 2012-12-07 NOTE — Consult Note (Signed)
[facility] Face-to-Face Psychiatry Consult   Reason for Consult:  intoxication Referring Physician:  ER MD  Donald Orozco is an 47 y.o. male.  Assessment: AXIS I:  Adjustment Disorder with Depressed Mood alcohol dependence AXIS II:  Deferred AXIS III:   Past Medical History  Diagnosis Date  . Bipolar 1 disorder    AXIS IV:  economic problems and housing problems AXIS V:  51-60 moderate symptoms  Plan:  No evidence of imminent risk to self or others at present.    Subjective:   Donald Orozco is a 47 y.o. male patient admitted with intoxication.  HPI:  Mr Donald Orozco has been drinking but not threatening to kill himself or anyone else.  Today he is sobering up and says he knows he needs help with his drinking but for now he has a job and wants to work while he can.  Continues denying any threats to himself or anyone else. HPI Elements:   Location:  ER. Quality:  intoxicated, sobering up. Severity:  mild at the moment. Timing:  drinking last night. Duration:  years. Context:  drinking last night.  Past Psychiatric History: Past Medical History  Diagnosis Date  . Bipolar 1 disorder     reports that he has been smoking Cigarettes.  He has been smoking about 1.00 pack per day. He has never used smokeless tobacco. He reports that he drinks about 14.4 ounces of alcohol per week. He reports that he does not use illicit drugs. History reviewed. No pertinent family history.         Allergies:  No Known Allergies  ACT Assessment Complete:  Yes:    Educational Status    Risk to Self:    Risk to Others:    Abuse:    Prior Inpatient Therapy:    Prior Outpatient Therapy:    Additional Information:                    Objective: Blood pressure 125/89, pulse 91, temperature 98.3 F (36.8 C), temperature source Oral, resp. rate 20, SpO2 95.00%.There is no weight on file to calculate BMI. Results for orders placed during the hospital encounter of 12/07/12 (from the past 72 hour(s))   URINE RAPID DRUG SCREEN (HOSP PERFORMED)     Status: Abnormal   Collection Time    12/07/12  2:47 AM      Result Value Range   Opiates NONE DETECTED  NONE DETECTED   Cocaine NONE DETECTED  NONE DETECTED   Benzodiazepines POSITIVE (*) NONE DETECTED   Amphetamines NONE DETECTED  NONE DETECTED   Tetrahydrocannabinol NONE DETECTED  NONE DETECTED   Barbiturates NONE DETECTED  NONE DETECTED   Comment:            DRUG SCREEN FOR MEDICAL PURPOSES     ONLY.  IF CONFIRMATION IS NEEDED     FOR ANY PURPOSE, NOTIFY LAB     WITHIN 5 DAYS.                LOWEST DETECTABLE LIMITS     FOR URINE DRUG SCREEN     Drug Class       Cutoff (ng/mL)     Amphetamine      1000     Barbiturate      200     Benzodiazepine   200     Tricyclics       300     Opiates          300  Cocaine          300     THC              50  CBC WITH DIFFERENTIAL     Status: None   Collection Time    12/07/12  2:52 AM      Result Value Range   WBC 9.6  4.0 - 10.5 K/uL   RBC 4.53  4.22 - 5.81 MIL/uL   Hemoglobin 14.6  13.0 - 17.0 g/dL   HCT 29.5  28.4 - 13.2 %   MCV 95.6  78.0 - 100.0 fL   MCH 32.2  26.0 - 34.0 pg   MCHC 33.7  30.0 - 36.0 g/dL   RDW 44.0  10.2 - 72.5 %   Platelets 269  150 - 400 K/uL   Neutrophils Relative % 57  43 - 77 %   Neutro Abs 5.5  1.7 - 7.7 K/uL   Lymphocytes Relative 35  12 - 46 %   Lymphs Abs 3.3  0.7 - 4.0 K/uL   Monocytes Relative 7  3 - 12 %   Monocytes Absolute 0.6  0.1 - 1.0 K/uL   Eosinophils Relative 2  0 - 5 %   Eosinophils Absolute 0.2  0.0 - 0.7 K/uL   Basophils Relative 1  0 - 1 %   Basophils Absolute 0.1  0.0 - 0.1 K/uL  COMPREHENSIVE METABOLIC PANEL     Status: Abnormal   Collection Time    12/07/12  2:52 AM      Result Value Range   Sodium 141  135 - 145 mEq/L   Potassium 3.9  3.5 - 5.1 mEq/L   Chloride 104  96 - 112 mEq/L   CO2 24  19 - 32 mEq/L   Glucose, Bld 80  70 - 99 mg/dL   BUN 9  6 - 23 mg/dL   Creatinine, Ser 3.66  0.50 - 1.35 mg/dL   Calcium 8.7   8.4 - 44.0 mg/dL   Total Protein 7.1  6.0 - 8.3 g/dL   Albumin 3.7  3.5 - 5.2 g/dL   AST 29  0 - 37 U/L   ALT 23  0 - 53 U/L   Alkaline Phosphatase 53  39 - 117 U/L   Total Bilirubin 0.2 (*) 0.3 - 1.2 mg/dL   GFR calc non Af Amer >90  >90 mL/min   GFR calc Af Amer >90  >90 mL/min   Comment: (NOTE)     The eGFR has been calculated using the CKD EPI equation.     This calculation has not been validated in all clinical situations.     eGFR's persistently <90 mL/min signify possible Chronic Kidney     Disease.  AMMONIA     Status: None   Collection Time    12/07/12  2:52 AM      Result Value Range   Ammonia 40  11 - 60 umol/L  ETHANOL     Status: Abnormal   Collection Time    12/07/12  2:52 AM      Result Value Range   Alcohol, Ethyl (B) 188 (*) 0 - 11 mg/dL   Comment:            LOWEST DETECTABLE LIMIT FOR     SERUM ALCOHOL IS 11 mg/dL     FOR MEDICAL PURPOSES ONLY   Labs are reviewed and are pertinent for alcohol.  No current facility-administered medications for this encounter.  Current Outpatient Prescriptions  Medication Sig Dispense Refill  . carbamazepine (TEGRETOL) 200 MG tablet Take 1 tablet (200 mg total) by mouth 3 (three) times daily. For mood stabilization.  90 tablet  0  . traZODone (DESYREL) 100 MG tablet Take 1 tablet (100 mg total) by mouth at bedtime as needed for sleep.  30 tablet  0  . azithromycin (ZITHROMAX Z-PAK) 250 MG tablet 2 po day one, then 1 daily x 4 days  5 tablet  0    Psychiatric Specialty Exam:     Blood pressure 125/89, pulse 91, temperature 98.3 F (36.8 C), temperature source Oral, resp. rate 20, SpO2 95.00%.There is no weight on file to calculate BMI.  General Appearance: Disheveled  Eye Contact::  Poor  Speech:  Clear and Coherent and Normal Rate  Volume:  Normal  Mood:  Irritable  Affect:  Appropriate  Thought Process:  Coherent and Goal Directed  Orientation:  Full (Time, Place, and Person)  Thought Content:  Negative   Suicidal Thoughts:  No  Homicidal Thoughts:  No  Memory:  Immediate;   Good Recent;   Good Remote;   Good  Judgement:  Intact  Insight:  Good  Psychomotor Activity:  Normal  Concentration:  Good  Recall:  Good  Akathisia:  Negative  Handed:  Right  AIMS (if indicated):     Assets:  Communication Skills Physical Health  Sleep:      Treatment Plan Summary: Daily contact with patient to assess and evaluate symptoms and progress in treatment Medication management discharge home when medically clear  Albertina Leise D 12/07/2012 12:07 PM

## 2012-12-07 NOTE — ED Notes (Signed)
Went in to assess pt and found him sitting on the floor holding his bible  Pt unable to speak complete sentences  Speech slurred  Pt mumbling something about 10 pills in a beer  Pt crying  Unable to answer any questions

## 2012-12-08 NOTE — ED Provider Notes (Signed)
Medical screening examination/treatment/procedure(s) were conducted as a shared visit with non-physician practitioner(s) and myself.  I personally evaluated the patient during the encounter  Hurman Horn, MD 12/08/12 [phone]

## 2012-12-10 NOTE — Progress Notes (Signed)
Patient Discharge Instructions:  After Visit Summary (AVS):   Faxed to:  12/10/12 Discharge Summary Note:   Faxed to:  12/10/12 Psychiatric Admission Assessment Note:   Faxed to:  12/10/12 Suicide Risk Assessment - Discharge Assessment:   Faxed to:  12/10/12 Faxed/Sent to the Next Level Care provider:  12/10/12 Faxed to [facility] @ [phone]  Jerelene Redden, 12/10/2012, 2:17 PM

## 2012-12-13 ENCOUNTER — Emergency Department (HOSPITAL_COMMUNITY)
Admission: EM | Admit: 2012-12-13 | Discharge: 2012-12-14 | Disposition: A | Discharge status: Home / Self Care | Payer: Self-pay | Attending: Emergency Medicine | Admitting: Emergency Medicine

## 2012-12-13 ENCOUNTER — Encounter (HOSPITAL_COMMUNITY): Payer: Self-pay

## 2012-12-13 DIAGNOSIS — S0083XA Contusion of other part of head, initial encounter: Secondary | ICD-10-CM

## 2012-12-13 DIAGNOSIS — F17200 Nicotine dependence, unspecified, uncomplicated: Secondary | ICD-10-CM | POA: Insufficient documentation

## 2012-12-13 DIAGNOSIS — Z792 Long term (current) use of antibiotics: Secondary | ICD-10-CM | POA: Insufficient documentation

## 2012-12-13 DIAGNOSIS — Z79899 Other long term (current) drug therapy: Secondary | ICD-10-CM | POA: Insufficient documentation

## 2012-12-13 DIAGNOSIS — S0510XA Contusion of eyeball and orbital tissues, unspecified eye, initial encounter: Secondary | ICD-10-CM | POA: Insufficient documentation

## 2012-12-13 DIAGNOSIS — S0003XA Contusion of scalp, initial encounter: Secondary | ICD-10-CM | POA: Insufficient documentation

## 2012-12-13 DIAGNOSIS — F319 Bipolar disorder, unspecified: Secondary | ICD-10-CM | POA: Insufficient documentation

## 2012-12-13 DIAGNOSIS — F10929 Alcohol use, unspecified with intoxication, unspecified: Secondary | ICD-10-CM

## 2012-12-13 DIAGNOSIS — F1010 Alcohol abuse, uncomplicated: Secondary | ICD-10-CM | POA: Insufficient documentation

## 2012-12-13 NOTE — ED Notes (Signed)
Molly Maduro, EMT sitting at bedside with patient for safety per charge nurse.
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
## 2012-12-14 ENCOUNTER — Emergency Department (HOSPITAL_COMMUNITY): Payer: Self-pay

## 2012-12-14 ENCOUNTER — Encounter (HOSPITAL_COMMUNITY): Payer: Self-pay | Admitting: Radiology

## 2012-12-14 LAB — RAPID URINE DRUG SCREEN, HOSP PERFORMED
Amphetamines: NOT DETECTED
Benzodiazepines: NOT DETECTED
Cocaine: NOT DETECTED
Opiates: NOT DETECTED
Tetrahydrocannabinol: NOT DETECTED

## 2012-12-14 LAB — URINALYSIS, ROUTINE W REFLEX MICROSCOPIC
Glucose, UA: NEGATIVE mg/dL
Hgb urine dipstick: NEGATIVE
Ketones, ur: NEGATIVE mg/dL
Specific Gravity, Urine: 1.006 (ref 1.005–1.030)
pH: 5 (ref 5.0–8.0)

## 2012-12-14 NOTE — ED Notes (Signed)
Sitting with patient for safety of patient.
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
## 2012-12-14 NOTE — ED Notes (Signed)
Gone with patient to CT as a Comptroller.

## 2012-12-20 ENCOUNTER — Encounter (HOSPITAL_COMMUNITY): Payer: Self-pay | Admitting: Emergency Medicine

## 2012-12-20 ENCOUNTER — Emergency Department (HOSPITAL_COMMUNITY)
Admission: EM | Admit: 2012-12-20 | Discharge: 2012-12-21 | Disposition: A | Discharge status: Home / Self Care | Payer: Self-pay | Attending: Emergency Medicine | Admitting: Emergency Medicine

## 2012-12-20 DIAGNOSIS — F319 Bipolar disorder, unspecified: Secondary | ICD-10-CM | POA: Insufficient documentation

## 2012-12-20 DIAGNOSIS — F29 Unspecified psychosis not due to a substance or known physiological condition: Secondary | ICD-10-CM | POA: Insufficient documentation

## 2012-12-20 DIAGNOSIS — F10929 Alcohol use, unspecified with intoxication, unspecified: Secondary | ICD-10-CM

## 2012-12-20 DIAGNOSIS — F1010 Alcohol abuse, uncomplicated: Secondary | ICD-10-CM | POA: Insufficient documentation

## 2012-12-20 DIAGNOSIS — F17200 Nicotine dependence, unspecified, uncomplicated: Secondary | ICD-10-CM | POA: Insufficient documentation

## 2012-12-20 DIAGNOSIS — F489 Nonpsychotic mental disorder, unspecified: Secondary | ICD-10-CM | POA: Insufficient documentation

## 2012-12-20 DIAGNOSIS — Z79899 Other long term (current) drug therapy: Secondary | ICD-10-CM | POA: Insufficient documentation

## 2012-12-20 DIAGNOSIS — R279 Unspecified lack of coordination: Secondary | ICD-10-CM | POA: Insufficient documentation

## 2012-12-20 NOTE — ED Provider Notes (Signed)
Medical screening examination/treatment/procedure(s) were performed by non-physician practitioner and as supervising physician I was immediately available for consultation/collaboration.  Hurman Horn, MD 12/20/12 2037
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
## 2012-12-20 NOTE — ED Provider Notes (Signed)
CSN: [id number]     Arrival date & time 12/07/12  0121 History   First MD Initiated Contact with Patient 12/07/12 0142     Chief Complaint  Patient presents with  . Medical Clearance  . Alcohol Intoxication   (Consider location/radiation/quality/duration/timing/severity/associated sxs/prior Treatment) HPI Comments: Intoxicated and thinks is is a test subject for the government    The history is provided by the patient.    Past Medical History  Diagnosis Date  . Bipolar 1 disorder    History reviewed. No pertinent past surgical history. History reviewed. No pertinent family history. History  Substance Use Topics  . Smoking status: Current Every Day Smoker -- 1.00 packs/day    Types: Cigarettes  . Smokeless tobacco: Never Used  . Alcohol Use: 14.4 oz/week    24 Cans of beer per week     Comment: every day.     Review of Systems  Respiratory: Negative for shortness of breath.   Cardiovascular: Negative for chest pain.  Gastrointestinal: Negative for abdominal pain.  Musculoskeletal: Negative for back pain.  Skin: Negative for wound.  Psychiatric/Behavioral: Positive for hallucinations.  All other systems reviewed and are negative.    Allergies  Review of patient's allergies indicates no known allergies.  Home Medications   Current Outpatient Rx  Name  Route  Sig  Dispense  Refill  . carbamazepine (TEGRETOL) 200 MG tablet   Oral   Take 1 tablet (200 mg total) by mouth 3 (three) times daily. For mood stabilization.   90 tablet   0   . traZODone (DESYREL) 100 MG tablet   Oral   Take 1 tablet (100 mg total) by mouth at bedtime as needed for sleep.   30 tablet   0   . azithromycin (ZITHROMAX Z-PAK) 250 MG tablet      2 po day one, then 1 daily x 4 days   5 tablet   0    BP 125/89  Pulse 91  Temp(Src) 98.3 F (36.8 C) (Oral)  Resp 20  SpO2 95% Physical Exam  Nursing note and vitals reviewed. Constitutional: He appears well-nourished.  HENT:  Head:  Normocephalic.  Eyes: Pupils are equal, round, and reactive to light.  Cardiovascular: Regular rhythm.  Tachycardia present.   Pulmonary/Chest: Effort normal.  Neurological: He is alert.  Skin: Skin is warm.  Psychiatric: His affect is inappropriate. His speech is rapid and/or pressured. He is actively hallucinating. Thought content is paranoid and delusional. He expresses inappropriate judgment.    ED Course  Procedures (including critical care time) Labs Review Labs Reviewed  COMPREHENSIVE METABOLIC PANEL - Abnormal; Notable for the following:    Total Bilirubin 0.2 (*)    All other components within normal limits  ETHANOL - Abnormal; Notable for the following:    Alcohol, Ethyl (B) 188 (*)    All other components within normal limits  URINE RAPID DRUG SCREEN (HOSP PERFORMED) - Abnormal; Notable for the following:    Benzodiazepines POSITIVE (*)    All other components within normal limits  CBC WITH DIFFERENTIAL  AMMONIA   Imaging Review No results found.  EKG Interpretation   None       MDM   1. Alcohol intoxication   2. CAP (community acquired pneumonia)   3. Lung nodule seen on imaging study        Arman Filter, NP 12/20/12 2030
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
## 2012-12-21 NOTE — ED Notes (Signed)
Pt sleeping. 

## 2013-03-23 ENCOUNTER — Emergency Department (HOSPITAL_COMMUNITY)
Admission: EM | Admit: 2013-03-23 | Discharge: 2013-03-25 | Disposition: A | Discharge status: Other Acute Inpatient Hospital | Payer: Federal, State, Local not specified - Other | Attending: Emergency Medicine | Admitting: Emergency Medicine

## 2013-03-23 ENCOUNTER — Encounter (HOSPITAL_COMMUNITY): Payer: Self-pay | Admitting: Emergency Medicine

## 2013-03-23 DIAGNOSIS — F3110 Bipolar disorder, current episode manic without psychotic features, unspecified: Secondary | ICD-10-CM

## 2013-03-23 DIAGNOSIS — F911 Conduct disorder, childhood-onset type: Secondary | ICD-10-CM | POA: Insufficient documentation

## 2013-03-23 DIAGNOSIS — Z0289 Encounter for other administrative examinations: Secondary | ICD-10-CM | POA: Insufficient documentation

## 2013-03-23 DIAGNOSIS — R45850 Homicidal ideations: Secondary | ICD-10-CM | POA: Insufficient documentation

## 2013-03-23 DIAGNOSIS — F17200 Nicotine dependence, unspecified, uncomplicated: Secondary | ICD-10-CM | POA: Insufficient documentation

## 2013-03-23 DIAGNOSIS — F1010 Alcohol abuse, uncomplicated: Secondary | ICD-10-CM

## 2013-03-23 DIAGNOSIS — Z79899 Other long term (current) drug therapy: Secondary | ICD-10-CM | POA: Insufficient documentation

## 2013-03-23 DIAGNOSIS — J3489 Other specified disorders of nose and nasal sinuses: Secondary | ICD-10-CM | POA: Insufficient documentation

## 2013-03-23 DIAGNOSIS — F319 Bipolar disorder, unspecified: Secondary | ICD-10-CM | POA: Insufficient documentation

## 2013-03-23 DIAGNOSIS — R45851 Suicidal ideations: Secondary | ICD-10-CM | POA: Insufficient documentation

## 2013-03-23 LAB — COMPREHENSIVE METABOLIC PANEL
ALK PHOS: 61 U/L (ref 39–117)
ALT: 23 U/L (ref 0–53)
AST: 23 U/L (ref 0–37)
Albumin: 3.9 g/dL (ref 3.5–5.2)
BUN: 7 mg/dL (ref 6–23)
CHLORIDE: 102 meq/L (ref 96–112)
CO2: 24 meq/L (ref 19–32)
Calcium: 8.6 mg/dL (ref 8.4–10.5)
Creatinine, Ser: 0.8 mg/dL (ref 0.50–1.35)
GFR calc Af Amer: >90 mL/min (ref >90)
GFR calc non Af Amer: >90 mL/min (ref >90)
Glucose, Bld: 100 mg/dL — ABNORMAL HIGH (ref 70–99)
Potassium: 4.1 mEq/L (ref 3.7–5.3)
Sodium: 139 mEq/L (ref 137–147)
Total Protein: 7.2 g/dL (ref 6.0–8.3)

## 2013-03-23 LAB — CBC WITH DIFFERENTIAL/PLATELET
BASOS PCT: 0 % (ref 0–1)
Basophils Absolute: 0 10*3/uL (ref 0.0–0.1)
Eosinophils Absolute: 0.2 10*3/uL (ref 0.0–0.7)
Eosinophils Relative: 2 % (ref 0–5)
HCT: 41.9 % (ref 39.0–52.0)
Hemoglobin: 14.5 g/dL (ref 13.0–17.0)
LYMPHS PCT: 39 % (ref 12–46)
Lymphs Abs: 3.9 10*3/uL (ref 0.7–4.0)
MCH: 32.6 pg (ref 26.0–34.0)
MCHC: 34.6 g/dL (ref 30.0–36.0)
MCV: 94.2 fL (ref 78.0–100.0)
MONOS PCT: 7 % (ref 3–12)
Monocytes Absolute: 0.7 10*3/uL (ref 0.1–1.0)
NEUTROS ABS: 5.1 10*3/uL (ref 1.7–7.7)
NEUTROS PCT: 51 % (ref 43–77)
Platelets: 263 10*3/uL (ref 150–400)
RBC: 4.45 MIL/uL (ref 4.22–5.81)
RDW: 12.6 % (ref 11.5–15.5)
WBC: 9.9 10*3/uL (ref 4.0–10.5)

## 2013-03-23 LAB — ETHANOL: ALCOHOL ETHYL (B): 181 mg/dL — AB (ref 0–11)

## 2013-03-23 MED ORDER — ZIPRASIDONE MESYLATE 20 MG IM SOLR
20.0000 mg | Freq: Once | INTRAMUSCULAR | Status: AC
  Administered 2013-03-23: 20 mg via INTRAMUSCULAR
  Filled 2013-03-23: qty 20

## 2013-03-23 MED ORDER — LORAZEPAM 1 MG PO TABS: 1.0000 mg | ORAL_TABLET | Freq: Three times a day (TID) | ORAL | Status: DC | PRN

## 2013-03-23 MED ORDER — NICOTINE 21 MG/24HR TD PT24
21.0000 mg | MEDICATED_PATCH | Freq: Every day | TRANSDERMAL | Status: DC
  Administered 2013-03-23 – 2013-03-25 (×3): 21 mg via TRANSDERMAL
  Filled 2013-03-23 (×3): qty 1

## 2013-03-23 MED ORDER — IBUPROFEN 200 MG PO TABS
600.0000 mg | ORAL_TABLET | Freq: Three times a day (TID) | ORAL | Status: DC | PRN
  Administered 2013-03-23: 600 mg via ORAL
  Filled 2013-03-23: qty 3
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
## 2013-03-23 NOTE — Progress Notes (Signed)
Writer was unable to assess patient due to his manic behaviors.  There was a security guard and a policeman in the patients room during the assessment.    During the assessment the patient had to be given medication in order to calm him down.

## 2013-03-23 NOTE — Progress Notes (Signed)
The triage nurse reports that the patient is in the process of being transferred to the Psych ED.  Writer attempted to inform the NP Dondra SpryGail but was unsuccessful in reaching her on the telephone.   Writer contacted the Psych ED and informed Gunnar Fusi(Venida) to [facility] at [phone] and I will be able to assess the patient.

## 2013-03-24 ENCOUNTER — Encounter (HOSPITAL_COMMUNITY): Payer: Self-pay | Admitting: Registered Nurse

## 2013-03-24 ENCOUNTER — Emergency Department (HOSPITAL_COMMUNITY): Payer: Self-pay

## 2013-03-24 DIAGNOSIS — F3160 Bipolar disorder, current episode mixed, unspecified: Secondary | ICD-10-CM

## 2013-03-24 DIAGNOSIS — Z91199 Patient's noncompliance with other medical treatment and regimen due to unspecified reason: Secondary | ICD-10-CM

## 2013-03-24 DIAGNOSIS — Z9119 Patient's noncompliance with other medical treatment and regimen: Secondary | ICD-10-CM

## 2013-03-24 DIAGNOSIS — R45850 Homicidal ideations: Secondary | ICD-10-CM

## 2013-03-24 DIAGNOSIS — F1994 Other psychoactive substance use, unspecified with psychoactive substance-induced mood disorder: Secondary | ICD-10-CM

## 2013-03-24 DIAGNOSIS — F1910 Other psychoactive substance abuse, uncomplicated: Secondary | ICD-10-CM

## 2013-03-24 LAB — RAPID URINE DRUG SCREEN, HOSP PERFORMED
Amphetamines: NOT DETECTED
Barbiturates: NOT DETECTED
Benzodiazepines: NOT DETECTED
Cocaine: NOT DETECTED
OPIATES: NOT DETECTED
TETRAHYDROCANNABINOL: NOT DETECTED

## 2013-03-24 MED ORDER — HYDROXYZINE HCL 25 MG PO TABS: 25.0000 mg | ORAL_TABLET | Freq: Three times a day (TID) | ORAL | Status: DC | PRN

## 2013-03-24 MED ORDER — CARBAMAZEPINE 200 MG PO TABS
200.0000 mg | ORAL_TABLET | Freq: Three times a day (TID) | ORAL | Status: DC
Start: 2013-03-24 — End: 2013-03-25
  Administered 2013-03-24 – 2013-03-25 (×4): 200 mg via ORAL
  Filled 2013-03-24 (×6): qty 1

## 2013-03-24 MED ORDER — TRAZODONE HCL 100 MG PO TABS
100.0000 mg | ORAL_TABLET | Freq: Every day | ORAL | Status: DC
  Administered 2013-03-24: 100 mg via ORAL
  Filled 2013-03-24: qty 1

## 2013-03-24 NOTE — ED Notes (Signed)
To x ray w/ mHt 

## 2013-03-24 NOTE — Consult Note (Signed)
[facility] Face-to-Face Psychiatry Consult   Reason for Consult:  Homicidal Thoughts towards people Referring Physician:  EDP  Donald Orozco is an 48 y.o. male.  Assessment: AXIS I:  Bipolar, mixed, Substance Abuse and Substance Induced Mood Disorder AXIS II:  Deferred AXIS III:   Past Medical History  Diagnosis Date  . Bipolar 1 disorder    AXIS IV:  economic problems, housing problems, other psychosocial or environmental problems, problems related to social environment and problems with primary support group AXIS V:  11-20 some danger of hurting self or others possible OR occasionally fails to maintain minimal personal hygiene OR gross impairment in communication  Plan:  Recommend psychiatric Inpatient admission when medically cleared.  Subjective:   Donald Orozco is a 48 y.o. male patient admitted with chief complaint of homicidal thoughts words people who lives in shelter.  HPI:  Patient seen drug abuse.  Patient is a 48 year old Caucasian homeless man who was brought in by police department and to the emergency room because patient is experiencing increased irritability anger and having homicidal thoughts towards the people who were living with him in the shelter.  Patient has history of bipolar disorder and alcohol abuse.  He has been noncompliant with his psychotropic medication.  He was last discharged from [facility] in September 2000 218 however he's been not taking his Tegretol and trazodone.  Patient was supposed to see [facility] but did not keep his appointment.  He admitted irritability, anger poor sleep, agitation and anger.  He believes people in the shelter are stealing his belongings and he is so upset that he was going to hurt someone.  Patient appears very irrational with rambling speech and there are times that it is difficult to get information from him.  He was easily irritable and at times loud.  He was also noticed talking to himself.  He denies any hallucination but  admitted some time he talks to himself to calm him down.  Patient admitted he has bipolar disorder.  Patient denies any suicidal thoughts.  He admitted that people are taking advantage from him.  He also present with small superficial abrasion to his wrist.  As per chart he is taking pieces of the laptop cart and trying to cut himself.  His blood alcohol level is 181.  His urine drug screen is pending.  Patient does not have any withdrawal symptoms. HPI Elements:   Location:  Agitation and depression.  Having thoughts of hurting people.. Quality:  poor. Severity:  moderate. Timing:  Past 2 weeks.  Past Psychiatric History: Past Medical History  Diagnosis Date  . Bipolar 1 disorder     reports that he has been smoking Cigarettes.  He has been smoking about 1.00 pack per day. He has never used smokeless tobacco. He reports that he drinks about 14.4 ounces of alcohol per week. He reports that he does not use illicit drugs. No family history on file. Family History Substance Abuse: No Family Supports: No Living Arrangements: Alone;Other (Comment) Can pt return to current living arrangement?: Yes Abuse/Neglect [facility]) Physical Abuse: Denies Verbal Abuse: Denies Sexual Abuse: Denies Allergies:  No Known Allergies  ACT Assessment Complete:   Educational Status    Risk to Self:   Risk to Others:   Abuse:   Prior Inpatient Therapy:   Prior Outpatient Therapy:   Additional Information:     Objective: Blood pressure 107/62, pulse 97, temperature 98.3 F (36.8 C), temperature source Oral, resp. rate 18, weight 171 lb (  77.565 kg), SpO2 96.00%.Body mass index is 25.24 kg/(m^2). Results for orders placed during the hospital encounter of 03/23/13 (from the past 72 hour(s))  ETHANOL     Status: Abnormal   Collection Time    03/23/13  9:45 PM      Result Value Range   Alcohol, Ethyl (B) 181 (*) 0 - 11 mg/dL   Comment:            LOWEST DETECTABLE LIMIT FOR     SERUM ALCOHOL IS 11 mg/dL      FOR MEDICAL PURPOSES ONLY  CBC WITH DIFFERENTIAL     Status: None   Collection Time    03/23/13  9:45 PM      Result Value Range   WBC 9.9  4.0 - 10.5 K/uL   RBC 4.45  4.22 - 5.81 MIL/uL   Hemoglobin 14.5  13.0 - 17.0 g/dL   HCT 41.9  39.0 - 52.0 %   MCV 94.2  78.0 - 100.0 fL   MCH 32.6  26.0 - 34.0 pg   MCHC 34.6  30.0 - 36.0 g/dL   RDW 12.6  11.5 - 15.5 %   Platelets 263  150 - 400 K/uL   Neutrophils Relative % 51  43 - 77 %   Neutro Abs 5.1  1.7 - 7.7 K/uL   Lymphocytes Relative 39  12 - 46 %   Lymphs Abs 3.9  0.7 - 4.0 K/uL   Monocytes Relative 7  3 - 12 %   Monocytes Absolute 0.7  0.1 - 1.0 K/uL   Eosinophils Relative 2  0 - 5 %   Eosinophils Absolute 0.2  0.0 - 0.7 K/uL   Basophils Relative 0  0 - 1 %   Basophils Absolute 0.0  0.0 - 0.1 K/uL  COMPREHENSIVE METABOLIC PANEL     Status: Abnormal   Collection Time    03/23/13  9:45 PM      Result Value Range   Sodium 139  137 - 147 mEq/L   Potassium 4.1  3.7 - 5.3 mEq/L   Chloride 102  96 - 112 mEq/L   CO2 24  19 - 32 mEq/L   Glucose, Bld 100 (*) 70 - 99 mg/dL   BUN 7  6 - 23 mg/dL   Creatinine, Ser 0.80  0.50 - 1.35 mg/dL   Calcium 8.6  8.4 - 10.5 mg/dL   Total Protein 7.2  6.0 - 8.3 g/dL   Albumin 3.9  3.5 - 5.2 g/dL   AST 23  0 - 37 U/L   ALT 23  0 - 53 U/L   Alkaline Phosphatase 61  39 - 117 U/L   Total Bilirubin <0.2 (*) 0.3 - 1.2 mg/dL   GFR calc non Af Amer >90  >90 mL/min   GFR calc Af Amer >90  >90 mL/min   Comment: (NOTE)     The eGFR has been calculated using the CKD EPI equation.     This calculation has not been validated in all clinical situations.     eGFR's persistently <90 mL/min signify possible Chronic Kidney     Disease.   Labs are reviewed and are pertinent for blood alcohol level 181.  Current Facility-Administered Medications  Medication Dose Route Frequency Provider Last Rate Last Dose  . carbamazepine (TEGRETOL) tablet 200 mg  200 mg Oral TID Kathlee Nations, MD   200 mg at 03/24/13 1021   . hydrOXYzine (ATARAX/VISTARIL) tablet 25 mg  25 mg  Oral TID PRN Kathlee Nations, MD      . ibuprofen (ADVIL,MOTRIN) tablet 600 mg  600 mg Oral Q8H PRN Garald Balding, NP   600 mg at 03/23/13 2306  . LORazepam (ATIVAN) tablet 1 mg  1 mg Oral Q8H PRN Garald Balding, NP      . nicotine (NICODERM CQ - dosed in mg/24 hours) patch 21 mg  21 mg Transdermal Daily Garald Balding, NP   21 mg at 03/24/13 1021  . traZODone (DESYREL) tablet 100 mg  100 mg Oral QHS Kathlee Nations, MD       Current Outpatient Prescriptions  Medication Sig Dispense Refill  . carbamazepine (TEGRETOL) 200 MG tablet Take 1 tablet (200 mg total) by mouth 3 (three) times daily. For mood stabilization.  90 tablet  0  . traZODone (DESYREL) 100 MG tablet Take 1 tablet (100 mg total) by mouth at bedtime as needed for sleep.  30 tablet  0    Psychiatric Specialty Exam:     Blood pressure 107/62, pulse 97, temperature 98.3 F (36.8 C), temperature source Oral, resp. rate 18, weight 171 lb (77.565 kg), SpO2 96.00%.Body mass index is 25.24 kg/(m^2).  General Appearance: Disheveled  Eye Contact::  Poor  Speech:  Pressured and Rambling  Volume:  Increased  Mood:  Angry and Irritable  Affect:  Constricted and Labile  Thought Process:  Disorganized, Irrelevant, Loose and Tangential  Orientation:  Full (Time, Place, and Person)  Thought Content:  Delusions and Paranoid Ideation  Suicidal Thoughts:  No  Homicidal Thoughts:  Yes.  without intent/plan  Memory:  Immediate;   Fair Recent;   Fair Remote;   Fair  Judgement:  Impaired  Insight:  Lacking  Psychomotor Activity:  Increased  Concentration:  Poor  Recall:  Poor  Akathisia:  No  Handed:  Right  AIMS (if indicated):     Assets:  Desire for Improvement  Sleep:      Treatment Plan Summary: Medication management Start Tegretol 200 mg 3 times a day and trazodone 100 mg at bedtime.  Patient will be admitted to behavioral [place]  when he is medically cleared and bed  available.  ARFEEN,SYED T. 03/24/2013 10:51 AM

## 2013-03-24 NOTE — ED Notes (Signed)
Nad, resting quietly 

## 2013-03-24 NOTE — ED Notes (Signed)
Up to the bathroom 

## 2013-03-24 NOTE — ED Notes (Signed)
TTS into see 

## 2013-03-24 NOTE — BH Assessment (Signed)
Assessment Note  Donald Orozco is an 48 y.o. male.   What led to ER visit:  Pt brought in by [employer] under IVC due to pt causing a disruption at shelter last night.  Pt reports people were trying to steal his stuff.  Pt denies suicidal, homicidal intent.  Pt denies prior hx of SI/HI.  Pt reports not sleeping for at least 2 days, being off medications for 3 months and being angry at shelter last night.  Pt appears more cooperative this morning but still appears unstable in his ability to recall events and hold conversation.    Pt appears manic in his behavior.  Pt unsteady on his feet, disoriented to surroundings within the psych ED but knows who he is, where he is and what led to him being here in the ED.  Pt does not recall how he interacted last night with the nurses when he arrived to the ED.  MSE by TTS:   Pressured speech, redirectable, cooperative, Ox3, interacted appropriately with TTS, mood impaired, lack of sleep may contribute, manic, restless, disoriented in terms of surroundings within ER psych ward (not sure where room is, unsteady gait, etc).  SA hx  Pt denies issues with substances.  Pt BAL was over 180 when he arrived last night and his UDS was inconclusive due to lack of cooperation from pt.  To be repeated this AM.  Pt reports he was arrested for having an open container in July and has completed court requirements.   Social Hx  Pt lives in shelter and was dismissed last night.  Unsure if pt can return to that shelter or will need another shelter.  Pt from [place].  Pt reports having a girlfriend from [place] and she is the reason he came to [place] 3 to 4 months ago.  Pt then says he was here in July.  Unclear how long pt has been in [place].  Pt identifies no supports.  Recommendations  Pt appears to need assistance restarting medication regiment.  Pt did not follow up with [facility].  Therefore pt may benefit from inptx or be treated in  ED.  Psychiatry to determine dispo.  Axis I: Bipolar, Manic, Major Depression, Recurrent severe and Mood Disorder NOS Axis II: Deferred Axis III:  Past Medical History  Diagnosis Date  . Bipolar 1 disorder    Axis IV: economic problems, housing problems, occupational problems, other psychosocial or environmental problems, problems related to legal system/crime, problems related to social environment and problems with primary support group Axis V: 31-40 impairment in reality testing  Past Medical History:  Past Medical History  Diagnosis Date  . Bipolar 1 disorder     History reviewed. No pertinent past surgical history.  Family History: No family history on file.  Social History:  reports that he has been smoking Cigarettes.  He has been smoking about 1.00 pack per day. He has never used smokeless tobacco. He reports that he drinks about 14.4 ounces of alcohol per week. He reports that he does not use illicit drugs.  Additional Social History:  Alcohol / Drug Use Pain Medications: na Prescriptions: na Over the Counter: na History of alcohol / drug use?: No history of alcohol / drug abuse  CIWA: CIWA-Ar BP: 107/62 mmHg Pulse Rate: 97 COWS:    Allergies: No Known Allergies  Home Medications:  (Not in a hospital admission)  OB/GYN Status:  No LMP for male patient.  General  Assessment Data Location of Assessment: [facility] ED Is this a Tele or Face-to-Face Assessment?: Face-to-Face Is this an Initial Assessment or a Re-assessment for this encounter?: Initial Assessment Living Arrangements: Alone;Other (Comment) Can pt return to current living arrangement?: Yes Admission Status: Involuntary Is patient capable of signing voluntary admission?: Yes Transfer from: Acute Hospital Referral Source: MD  Medical Screening Exam [facility] Walk-in ONLY) Medical Exam completed: Yes  [facility] Crisis Care Plan Living Arrangements: Alone;Other (Comment)  Education Status Is patient currently  in school?: No Current Grade: na Highest grade of school patient has completed: na Name of school: na Contact person: na  Risk to self Suicidal Ideation: No-Not Currently/Within Last 6 Months Suicidal Intent: No-Not Currently/Within Last 6 Months Is patient at risk for suicide?: No Suicidal Plan?: No-Not Currently/Within Last 6 Months Access to Means: No What has been your use of drugs/alcohol within the last 12 months?: pt denies (pt alcohol was 180 last night; UDSnot back) Previous Attempts/Gestures: No How many times?: 0 Other Self Harm Risks: na Triggers for Past Attempts: Unpredictable;Other (Comment) (off med) Intentional Self Injurious Behavior: None Family Suicide History: No Recent stressful life event(s): Other (Comment);Conflict (Comment) (off meds and break up with GF) Persecutory voices/beliefs?: No Depression: Yes Depression Symptoms: Fatigue;Guilt;Loss of interest in usual pleasures;Feeling worthless/self pity;Feeling angry/irritable Substance abuse history and/or treatment for substance abuse?: No Suicide prevention information given to non-admitted patients: Not applicable  Risk to Others Homicidal Ideation: No Thoughts of Harm to Others: No Current Homicidal Intent: No Current Homicidal Plan: No Access to Homicidal Means: No Identified Victim: na History of harm to others?: No Assessment of Violence: None Noted Violent Behavior Description: cooperate but manic Does patient have access to weapons?: No Criminal Charges Pending?: No Does patient have a court date: No  Psychosis Hallucinations: None noted Delusions: Unspecified  Mental Status Report Appear/Hygiene: Disheveled Eye Contact: Fair Motor Activity: Restlessness Speech: Pressured;Tangential Level of Consciousness: Alert Mood: Depressed;Anxious;Suspicious;Labile Affect: Anxious;Depressed;Irritable Anxiety Level: Moderate Thought Processes: Tangential Judgement: Impaired Orientation:  Person;Place Obsessive Compulsive Thoughts/Behaviors: None  Cognitive Functioning Concentration: Decreased Memory: Remote Intact;Recent Impaired;Recent Intact (recall does not appear well but pt is unsure of events) IQ: Average Insight: Poor Impulse Control: Poor Appetite: Fair Weight Loss: 0 Weight Gain: 0 Sleep: Decreased Total Hours of Sleep: 2 (HADN'T SLEPT IN 2 DAYS) Vegetative Symptoms: None  ADLScreening [facility] Assessment Services) Patient's cognitive ability adequate to safely complete daily activities?: Yes Patient able to express need for assistance with ADLs?: Yes Independently performs ADLs?: Yes (appropriate for developmental age)  Prior Inpatient Therapy Prior Inpatient Therapy: Yes Prior Therapy Dates: 9/14 Prior Therapy Facilty/Provider(s): [facility] Reason for Treatment: off meds and manic  Prior Outpatient Therapy Prior Outpatient Therapy: No Prior Therapy Dates: na Prior Therapy Facilty/Provider(s): na Reason for Treatment: na  ADL Screening (condition at time of admission) Patient's cognitive ability adequate to safely complete daily activities?: Yes Is the patient deaf or have difficulty hearing?: No Does the patient have difficulty seeing, even when wearing glasses/contacts?: No Does the patient have difficulty concentrating, remembering, or making decisions?: Yes Patient able to express need for assistance with ADLs?: Yes Does the patient have difficulty dressing or bathing?: No Independently performs ADLs?: Yes (appropriate for developmental age) Does the patient have difficulty walking or climbing stairs?: No Weakness of Legs: None Weakness of Arms/Hands: None  Home Assistive Devices/Equipment Home Assistive Devices/Equipment: None  Therapy Consults (therapy consults require a physician order) PT Evaluation Needed: No OT Evalulation Needed: No SLP Evaluation Needed: No Abuse/Neglect  Assessment (Assessment to be complete while patient is  alone) Physical Abuse: Denies Verbal Abuse: Denies Sexual Abuse: Denies Exploitation of patient/patient's resources: Denies Self-Neglect: Denies Values / Beliefs Cultural Requests During Hospitalization: None Spiritual Requests During Hospitalization: None Consults Spiritual Care Consult Needed: No Social Work Consult Needed: No Merchant navy officer (For Healthcare) Advance Directive: Patient does not have advance directive Pre-existing out of facility DNR order (yellow form or pink MOST form): No    Additional Information 1:1 In Past 12 Months?: No CIRT Risk: No Elopement Risk: No Does patient have medical clearance?: Yes     Disposition:  Disposition Initial Assessment Completed for this Encounter: Yes Disposition of Patient: Inpatient treatment program Type of inpatient treatment program: Adult  On Site Evaluation by:   Reviewed with Physician:    Donald Orozco, Donald Orozco 03/24/2013 10:04 AM

## 2013-03-24 NOTE — ED Notes (Signed)
To the bathroom 

## 2013-03-24 NOTE — ED Notes (Signed)
Sandwich given 

## 2013-03-24 NOTE — ED Notes (Signed)
Psych md and NP into see 

## 2013-03-24 NOTE — BH Assessment (Signed)
[facility]   Clinician notified by nurse in psych ED that patient had been given geodon and may be too sedated to assess at this time.  Clinician was on site and checked on patient who was snoring quite loudly and sleeping.  Will attempt to assess when patient is awake and alert.

## 2013-03-24 NOTE — ED Notes (Signed)
Taking w/ pharm tech

## 2013-03-24 NOTE — ED Provider Notes (Signed)
Results for orders placed during the hospital encounter of 03/23/13  ETHANOL      Result Value Range   Alcohol, Ethyl (B) 181 (*) 0 - 11 mg/dL  CBC WITH DIFFERENTIAL      Result Value Range   WBC 9.9  4.0 - 10.5 K/uL   RBC 4.45  4.22 - 5.81 MIL/uL   Hemoglobin 14.5  13.0 - 17.0 g/dL   HCT 82.941.9  56.239.0 - 13.052.0 %   MCV 94.2  78.0 - 100.0 fL   MCH 32.6  26.0 - 34.0 pg   MCHC 34.6  30.0 - 36.0 g/dL   RDW 86.512.6  78.411.5 - 69.615.5 %   Platelets 263  150 - 400 K/uL   Neutrophils Relative % 51  43 - 77 %   Neutro Abs 5.1  1.7 - 7.7 K/uL   Lymphocytes Relative 39  12 - 46 %   Lymphs Abs 3.9  0.7 - 4.0 K/uL   Monocytes Relative 7  3 - 12 %   Monocytes Absolute 0.7  0.1 - 1.0 K/uL   Eosinophils Relative 2  0 - 5 %   Eosinophils Absolute 0.2  0.0 - 0.7 K/uL   Basophils Relative 0  0 - 1 %   Basophils Absolute 0.0  0.0 - 0.1 K/uL  COMPREHENSIVE METABOLIC PANEL      Result Value Range   Sodium 139  137 - 147 mEq/L   Potassium 4.1  3.7 - 5.3 mEq/L   Chloride 102  96 - 112 mEq/L   CO2 24  19 - 32 mEq/L   Glucose, Bld 100 (*) 70 - 99 mg/dL   BUN 7  6 - 23 mg/dL   Creatinine, Ser 2.950.80  0.50 - 1.35 mg/dL   Calcium 8.6  8.4 - 28.410.5 mg/dL   Total Protein 7.2  6.0 - 8.3 g/dL   Albumin 3.9  3.5 - 5.2 g/dL   AST 23  0 - 37 U/L   ALT 23  0 - 53 U/L   Alkaline Phosphatase 61  39 - 117 U/L   Total Bilirubin <0.2 (*) 0.3 - 1.2 mg/dL   GFR calc non Af Amer >90  >90 mL/min   GFR calc Af Amer >90  >90 mL/min  URINE RAPID DRUG SCREEN (HOSP PERFORMED)      Result Value Range   Opiates NONE DETECTED  NONE DETECTED   Cocaine NONE DETECTED  NONE DETECTED   Benzodiazepines NONE DETECTED  NONE DETECTED   Amphetamines NONE DETECTED  NONE DETECTED   Tetrahydrocannabinol NONE DETECTED  NONE DETECTED   Barbiturates NONE DETECTED  NONE DETECTED   Dg Chest 2 View  03/24/2013   CLINICAL DATA:  Productive cough, smoker  EXAM: CHEST  2 VIEW  COMPARISON:  12/07/2012  FINDINGS: The heart size and mediastinal contours are  within normal limits. Both lungs are clear. The visualized skeletal structures are unremarkable.  IMPRESSION: No active cardiopulmonary disease.   Electronically Signed   By: Ruel Favorsrevor  Shick M.D.   On: 03/24/2013 12:37    Medical screening examination/treatment/procedure(s) were conducted as a shared visit with non-physician practitioner(s) and myself.  I personally evaluated the patient during the encounter.  EKG Interpretation   None       Pt with depression, SI. Pt alert, content, depressed mood. Labs. Psych eval.   Suzi RootsKevin E Royalty Domagala, MD 03/24/13 [phone]

## 2013-03-25 ENCOUNTER — Encounter (HOSPITAL_COMMUNITY): Payer: Self-pay | Admitting: Behavioral Health

## 2013-03-25 ENCOUNTER — Inpatient Hospital Stay (HOSPITAL_COMMUNITY)
Admission: AD | Admit: 2013-03-25 | Discharge: 2013-03-30 | DRG: 885 | Disposition: A | Discharge status: Home / Self Care | Payer: Federal, State, Local not specified - Other | Source: Intra-hospital | Attending: Psychiatry | Admitting: Psychiatry

## 2013-03-25 DIAGNOSIS — F10239 Alcohol dependence with withdrawal, unspecified: Secondary | ICD-10-CM | POA: Diagnosis present

## 2013-03-25 DIAGNOSIS — R569 Unspecified convulsions: Secondary | ICD-10-CM

## 2013-03-25 DIAGNOSIS — F1010 Alcohol abuse, uncomplicated: Secondary | ICD-10-CM

## 2013-03-25 DIAGNOSIS — F10230 Alcohol dependence with withdrawal, uncomplicated: Secondary | ICD-10-CM

## 2013-03-25 DIAGNOSIS — F319 Bipolar disorder, unspecified: Secondary | ICD-10-CM | POA: Diagnosis present

## 2013-03-25 DIAGNOSIS — F10930 Alcohol use, unspecified with withdrawal, uncomplicated: Secondary | ICD-10-CM | POA: Diagnosis present

## 2013-03-25 DIAGNOSIS — F10939 Alcohol use, unspecified with withdrawal, unspecified: Secondary | ICD-10-CM | POA: Diagnosis present

## 2013-03-25 DIAGNOSIS — F1020 Alcohol dependence, uncomplicated: Secondary | ICD-10-CM | POA: Diagnosis present

## 2013-03-25 DIAGNOSIS — Z79899 Other long term (current) drug therapy: Secondary | ICD-10-CM

## 2013-03-25 DIAGNOSIS — F3110 Bipolar disorder, current episode manic without psychotic features, unspecified: Principal | ICD-10-CM | POA: Diagnosis present

## 2013-03-25 MED ORDER — CARBAMAZEPINE 200 MG PO TABS
200.0000 mg | ORAL_TABLET | Freq: Three times a day (TID) | ORAL | Status: DC
  Administered 2013-03-25 – 2013-03-26 (×2): 200 mg via ORAL
  Filled 2013-03-25 (×7): qty 1

## 2013-03-25 MED ORDER — HYDROXYZINE HCL 25 MG PO TABS: 25.0000 mg | ORAL_TABLET | Freq: Three times a day (TID) | ORAL | Status: DC | PRN

## 2013-03-25 MED ORDER — PNEUMOCOCCAL VAC POLYVALENT 25 MCG/0.5ML IJ INJ
0.5000 mL | INJECTION | INTRAMUSCULAR | Status: AC
  Administered 2013-03-26: 0.5 mL via INTRAMUSCULAR

## 2013-03-25 MED ORDER — ACETAMINOPHEN 325 MG PO TABS: 650.0000 mg | ORAL_TABLET | Freq: Four times a day (QID) | ORAL | Status: DC | PRN

## 2013-03-25 MED ORDER — MAGNESIUM HYDROXIDE 400 MG/5ML PO SUSP: 30.0000 mL | Freq: Every day | ORAL | Status: DC | PRN

## 2013-03-25 MED ORDER — IBUPROFEN 200 MG PO TABS: 600.0000 mg | ORAL_TABLET | Freq: Three times a day (TID) | ORAL | Status: DC | PRN

## 2013-03-25 MED ORDER — TRAZODONE HCL 100 MG PO TABS
100.0000 mg | ORAL_TABLET | Freq: Every day | ORAL | Status: DC
  Administered 2013-03-25: 100 mg via ORAL
  Filled 2013-03-25 (×3): qty 1

## 2013-03-25 MED ORDER — NICOTINE 21 MG/24HR TD PT24
21.0000 mg | MEDICATED_PATCH | Freq: Every day | TRANSDERMAL | Status: DC
  Administered 2013-03-26 – 2013-03-29 (×4): 21 mg via TRANSDERMAL
  Filled 2013-03-25: qty 1
  Filled 2013-03-25: qty 14
  Filled 2013-03-25 (×4): qty 1
  Filled 2013-03-25: qty 14

## 2013-03-25 MED ORDER — LORAZEPAM 1 MG PO TABS
1.0000 mg | ORAL_TABLET | Freq: Three times a day (TID) | ORAL | Status: DC | PRN
Start: 2013-03-25 — End: 2013-03-26

## 2013-03-25 MED ORDER — ALUM & MAG HYDROXIDE-SIMETH 200-200-20 MG/5ML PO SUSP: 30.0000 mL | ORAL | Status: DC | PRN

## 2013-03-25 NOTE — BHH Counselor (Addendum)
[facility] Thurman CoyerEric Kaplan [facility], pt has been accepted to bed 406[facility]2. Completed support [facility]. [employer] placed in pt's chart. RN is arranging transport.  Evette Cristalaroline Paige Anisa Leanos, [place] Assessment Counselor

## 2013-03-25 NOTE — Tx Team (Signed)
Initial Interdisciplinary Treatment Plan  PATIENT STRENGTHS: (choose at least two) Ability for insight Motivation for treatment/growth  PATIENT STRESSORS: Medication change or noncompliance Substance abuse   PROBLEM LIST: Problem List/Patient Goals Date to be addressed Date deferred Reason deferred Estimated date of resolution  Manic  03/25/13     Homeless  03/25/13                                                DISCHARGE CRITERIA:  Ability to meet basic life and health needs Adequate post-discharge living arrangements Improved stabilization in mood, thinking, and/or behavior Verbal commitment to aftercare and medication compliance  PRELIMINARY DISCHARGE PLAN: Attend aftercare/continuing care group Attend PHP/IOP  PATIENT/FAMIILY INVOLVEMENT: This treatment plan has been presented to and reviewed with the patient, Donald Orozco, and/or family member.  The patient and family have been given the opportunity to ask questions and make suggestions.  Donald Orozco L 03/25/2013, 4:46 PM

## 2013-03-25 NOTE — Progress Notes (Signed)
[facility] MD Progress Note  03/25/2013 9:59 AM Donald Orozco  MRN:  [id number] Subjective: Patient is a 48 year old male patient admitted with chief complaint of homicidal thoughts towards people in the shelter.  Diagnosis:   DSM5: Axis I: Alcohol Abuse, Bipolar, mixed and Substance Induced Mood Disorder Axis II: Deferred Axis III:  Past Medical History  Diagnosis Date  . Bipolar 1 disorder    Axis IV: economic problems, educational problems, housing problems, occupational problems, other psychosocial or environmental problems, problems related to legal system/crime, problems related to social environment, problems with access to health care services and problems with primary support group Axis V: 11-20 some danger of hurting self or others possible OR occasionally fails to maintain minimal personal hygiene OR gross impairment in communication  ADL's:  Impaired  Sleep: Poor  Appetite:  Fair  Suicidal Ideation:  Plan:   none Intent:  none Means:  none Homicidal Ideation:  Plan:   none Intent:  nond Means:  none AEB (as evidenced by):  Psychiatric Specialty Exam: ROS  Blood pressure 115/80, pulse 86, temperature 97.4 F (36.3 C), temperature source Oral, resp. rate 18, weight 77.565 kg (171 lb), SpO2 99.00%.Body mass index is 25.24 kg/(m^2).  General Appearance: Casual and Disheveled  Eye Contact::  Fair  Speech:  Garbled  Volume:  Increased  Mood:  Angry and Irritable  Affect:  Constricted and Labile  Thought Process:  Circumstantial, Disorganized and Irrelevant  Orientation:  Full (Time, Place, and Person)  Thought Content:  Rumination  Suicidal Thoughts:  No  Homicidal Thoughts:  Yes.  without intent/plan  Memory:  Immediate;   Poor Recent;   Poor Remote;   Poor  Judgement:  Impaired  Insight:  Lacking  Psychomotor Activity:  Increased and Restlessness  Concentration:  Poor  Recall:  Poor  Akathisia:  No  Handed:  Right  AIMS (if indicated):   0  Assets:  Desire for  Improvement  Sleep:   poor   Current Medications: Current Facility-Administered Medications  Medication Dose Route Frequency Provider Last Rate Last Dose  . carbamazepine (TEGRETOL) tablet 200 mg  200 mg Oral TID Kathlee Nations, MD   200 mg at 03/25/13 0935  . hydrOXYzine (ATARAX/VISTARIL) tablet 25 mg  25 mg Oral TID PRN Kathlee Nations, MD      . ibuprofen (ADVIL,MOTRIN) tablet 600 mg  600 mg Oral Q8H PRN Garald Balding, NP   600 mg at 03/23/13 2306  . LORazepam (ATIVAN) tablet 1 mg  1 mg Oral Q8H PRN Garald Balding, NP      . nicotine (NICODERM CQ - dosed in mg/24 hours) patch 21 mg  21 mg Transdermal Daily Garald Balding, NP   21 mg at 03/25/13 0935  . traZODone (DESYREL) tablet 100 mg  100 mg Oral QHS Kathlee Nations, MD   100 mg at 03/24/13 2129   Current Outpatient Prescriptions  Medication Sig Dispense Refill  . carbamazepine (TEGRETOL) 200 MG tablet Take 1 tablet (200 mg total) by mouth 3 (three) times daily. For mood stabilization.  90 tablet  0  . hydrOXYzine (ATARAX/VISTARIL) 25 MG tablet Take 25 mg by mouth 3 (three) times daily as needed for anxiety or itching.      . traZODone (DESYREL) 150 MG tablet Take 150 mg by mouth at bedtime.        Lab Results:  Results for orders placed during the hospital encounter of 03/23/13 (from the past 48 hour(s))  ETHANOL     Status: Abnormal   Collection Time    03/23/13  9:45 PM      Result Value Range   Alcohol, Ethyl (B) 181 (*) 0 - 11 mg/dL   Comment:            LOWEST DETECTABLE LIMIT FOR     SERUM ALCOHOL IS 11 mg/dL     FOR MEDICAL PURPOSES ONLY  CBC WITH DIFFERENTIAL     Status: None   Collection Time    03/23/13  9:45 PM      Result Value Range   WBC 9.9  4.0 - 10.5 K/uL   RBC 4.45  4.22 - 5.81 MIL/uL   Hemoglobin 14.5  13.0 - 17.0 g/dL   HCT 41.9  39.0 - 52.0 %   MCV 94.2  78.0 - 100.0 fL   MCH 32.6  26.0 - 34.0 pg   MCHC 34.6  30.0 - 36.0 g/dL   RDW 12.6  11.5 - 15.5 %   Platelets 263  150 - 400 K/uL   Neutrophils  Relative % 51  43 - 77 %   Neutro Abs 5.1  1.7 - 7.7 K/uL   Lymphocytes Relative 39  12 - 46 %   Lymphs Abs 3.9  0.7 - 4.0 K/uL   Monocytes Relative 7  3 - 12 %   Monocytes Absolute 0.7  0.1 - 1.0 K/uL   Eosinophils Relative 2  0 - 5 %   Eosinophils Absolute 0.2  0.0 - 0.7 K/uL   Basophils Relative 0  0 - 1 %   Basophils Absolute 0.0  0.0 - 0.1 K/uL  COMPREHENSIVE METABOLIC PANEL     Status: Abnormal   Collection Time    03/23/13  9:45 PM      Result Value Range   Sodium 139  137 - 147 mEq/L   Potassium 4.1  3.7 - 5.3 mEq/L   Chloride 102  96 - 112 mEq/L   CO2 24  19 - 32 mEq/L   Glucose, Bld 100 (*) 70 - 99 mg/dL   BUN 7  6 - 23 mg/dL   Creatinine, Ser 0.80  0.50 - 1.35 mg/dL   Calcium 8.6  8.4 - 10.5 mg/dL   Total Protein 7.2  6.0 - 8.3 g/dL   Albumin 3.9  3.5 - 5.2 g/dL   AST 23  0 - 37 U/L   ALT 23  0 - 53 U/L   Alkaline Phosphatase 61  39 - 117 U/L   Total Bilirubin <0.2 (*) 0.3 - 1.2 mg/dL   GFR calc non Af Amer >90  >90 mL/min   GFR calc Af Amer >90  >90 mL/min   Comment: (NOTE)     The eGFR has been calculated using the CKD EPI equation.     This calculation has not been validated in all clinical situations.     eGFR's persistently <90 mL/min signify possible Chronic Kidney     Disease.  URINE RAPID DRUG SCREEN (HOSP PERFORMED)     Status: None   Collection Time    03/24/13 11:32 AM      Result Value Range   Opiates NONE DETECTED  NONE DETECTED   Cocaine NONE DETECTED  NONE DETECTED   Benzodiazepines NONE DETECTED  NONE DETECTED   Amphetamines NONE DETECTED  NONE DETECTED   Tetrahydrocannabinol NONE DETECTED  NONE DETECTED   Barbiturates NONE DETECTED  NONE DETECTED   Comment:  DRUG SCREEN FOR MEDICAL PURPOSES     ONLY.  IF CONFIRMATION IS NEEDED     FOR ANY PURPOSE, NOTIFY LAB     WITHIN 5 DAYS.                LOWEST DETECTABLE LIMITS     FOR URINE DRUG SCREEN     Drug Class       Cutoff (ng/mL)     Amphetamine      1000     Barbiturate       200     Benzodiazepine   606     Tricyclics       770     Opiates          300     Cocaine          300     THC              50    Physical Findings: AIMS:  , ,  ,  ,    CIWA:    COWS:       Plan: To be transferred to [facility]; awaiting for bed. He remains irritable, angry, pressured speech, easily agitated, distractible, irrational thinking, delusions and paranoid ideations. He does not have any withdrawal symptoms. He denies DT's or seizures.   Medical Decision Making Problem Points:  Established problem, worsening (2) Data Points:  Independent review of image, tracing, or specimen (2)    BLANKMANN, MEGHAN 03/25/2013, 9:59 AM
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
## 2013-03-25 NOTE — ED Notes (Signed)
Transport request called to [employer].

## 2013-03-25 NOTE — ED Notes (Signed)
Report to Liborio NixonPatrice White RN for transfer to [id number].

## 2013-03-25 NOTE — Progress Notes (Signed)
Adult Psychoeducational Group Note  Date:  03/25/2013 Time:  10:16 PM  Group Topic/Focus:  Wrap-Up Group:   The focus of this group is to help patients review their daily goal of treatment and discuss progress on daily workbooks.  Participation Level:  Minimal  Participation Quality:  Inattentive  Affect:  Labile  Cognitive:  Lacking  Insight: Limited  Engagement in Group:  Limited  Modes of Intervention:  Support  Additional Comments:  Patient attended and participated in group tonight. He reports that he was happy to be here today. He can get medication, good food and will sleep in a room and on a bed tonight, because he was not able to do that due to being homeless.  Lita MainsFrancis, Azeez Dunker [facility] 03/25/2013, 10:16 PM

## 2013-03-26 DIAGNOSIS — F3110 Bipolar disorder, current episode manic without psychotic features, unspecified: Principal | ICD-10-CM

## 2013-03-26 DIAGNOSIS — F1020 Alcohol dependence, uncomplicated: Secondary | ICD-10-CM | POA: Diagnosis present

## 2013-03-26 MED ORDER — HYDROXYZINE HCL 50 MG PO TABS: 50.0000 mg | ORAL_TABLET | Freq: Three times a day (TID) | ORAL | Status: DC | PRN

## 2013-03-26 MED ORDER — TRAZODONE HCL 150 MG PO TABS
150.0000 mg | ORAL_TABLET | Freq: Every day | ORAL | Status: DC
  Administered 2013-03-26 – 2013-03-29 (×4): 150 mg via ORAL
  Filled 2013-03-26 (×3): qty 1
  Filled 2013-03-26: qty 14
  Filled 2013-03-26: qty 1
  Filled 2013-03-26: qty 14

## 2013-03-26 MED ORDER — CARBAMAZEPINE 200 MG PO TABS
600.0000 mg | ORAL_TABLET | Freq: Two times a day (BID) | ORAL | Status: DC
  Administered 2013-03-26 – 2013-03-30 (×8): 600 mg via ORAL
  Filled 2013-03-26 (×2): qty 84
  Filled 2013-03-26 (×2): qty 3
  Filled 2013-03-26 (×2): qty 84
  Filled 2013-03-26 (×6): qty 3

## 2013-03-26 MED ORDER — OLANZAPINE 10 MG PO TBDP: 10.0000 mg | ORAL_TABLET | Freq: Three times a day (TID) | ORAL | Status: DC | PRN

## 2013-03-26 MED ORDER — LORAZEPAM 1 MG PO TABS: 1.0000 mg | ORAL_TABLET | Freq: Four times a day (QID) | ORAL | Status: DC | PRN

## 2013-03-26 MED ORDER — DM-GUAIFENESIN ER 30-600 MG PO TB12
1.0000 | ORAL_TABLET | Freq: Two times a day (BID) | ORAL | Status: DC | PRN
  Administered 2013-03-26 – 2013-03-30 (×6): 1 via ORAL
  Filled 2013-03-26 (×7): qty 1

## 2013-03-26 MED ORDER — INFLUENZA VAC SPLIT QUAD 0.5 ML IM SUSP
0.5000 mL | INTRAMUSCULAR | Status: AC
  Administered 2013-03-26: 0.5 mL via INTRAMUSCULAR
  Filled 2013-03-26: qty 0.5

## 2013-03-26 MED ORDER — CARBAMAZEPINE 200 MG PO TABS: 600.0000 mg | ORAL_TABLET | Freq: Two times a day (BID) | ORAL | Status: DC

## 2013-03-26 NOTE — [facility] Group Notes (Signed)
[facility] Group Notes:  (Nursing/MHT/Case Management/Adjunct)  Date:  03/26/2013  Time:  0930  Type of Therapy:  Nurse Education  Participation Level:  Minimal  Participation Quality:  Intrusive  Affect:  Blunted and Irritable  Cognitive:  Disorganized  Insight:  Lacking  Engagement in Group:  Lacking and Off Topic  Modes of Intervention:  Discussion, Education and Support  Summary of Progress/Problems: Pt could not stay on topic during group. Pt focused on how upset he was with the MD because he was not able to express how his meds have worked in the past because the MD cut him off. Writer was not able to redirect pt.   Courtney Fenlon L 03/26/2013, 11:52 AM

## 2013-03-26 NOTE — [facility] Group Notes (Signed)
[facility] LCSW Group Therapy  03/26/2013 , 2:11 PM   Type of Therapy:  Group Therapy  Participation Level:  Active  Participation Quality:  Somewhat disruptive  Affect:  Appropriate  Cognitive:  Alert  Insight:  Improving  Engagement in Therapy:  Engaged  Modes of Intervention:  Discussion, Exploration and Socialization  Summary of Progress/Problems: Today's group focused on the term Diagnosis.  Participants were asked to define the term, and then pronounce whether it is a negative, positive or neutral term.  Donald Orozco talked about diagnosis in terms of "finding out what is wrong with you."  He admitted that he has both mental health and substance abuse diagnoses while pointing out the difference between "using alcohol to go to sleep vs drinking to get drunk on the corner with your friends.  That can lead to bad things."  He cited [place] very supportive community of [place]    Donald Orozco, Donald Orozco 03/26/2013 , 2:11 PM

## 2013-03-26 NOTE — [facility] Suicide Risk Assessment (Signed)
Suicide Risk Assessment  Admission Assessment     Nursing information obtained from:  Patient Demographic factors:  Caucasian;Low socioeconomic status;Living alone;Unemployed Current Mental Status:  Suicidal ideation indicated by others Loss Factors:  Financial problems / change in socioeconomic status Historical Factors:  Impulsivity Risk Reduction Factors:  Sense of responsibility to family  CLINICAL FACTORS:   Severe Anxiety and/or Agitation Bipolar Disorder:   Manic State Alcohol/Substance Abuse/Dependencies Currently Psychotic Unstable or Poor Therapeutic Relationship  COGNITIVE FEATURES THAT CONTRIBUTE TO RISK:  Polarized thinking    SUICIDE RISK:   Minimal: No identifiable suicidal ideation.  Patients presenting with no risk factors but with morbid ruminations; may be classified as minimal risk based on the severity of the depressive symptoms  PLAN OF CARE:1. Admit for crisis management and stabilization. 2. Medication management to reduce current symptoms to base line and improve the     patient's overall level of functioning 3. Treat health problems as indicated. 4. Develop treatment plan to decrease risk of relapse upon discharge and the need for     readmission. 5. Psycho-social education regarding relapse prevention and self care. 6. Health care follow up as needed for medical problems. 7. Restart home medications where appropriate.   I certify that inpatient services furnished can reasonably be expected to improve the patient's condition.  Thedore MinsAkintayo, Tessla Spurling, MD 03/26/2013, 11:11 AM
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
## 2013-03-26 NOTE — Progress Notes (Addendum)
Adult Psychoeducational Group Note  Date:  03/26/2013 Time:  6:09 PM  Group Topic/Focus:  Recovery Goals:   The focus of this group is to identify appropriate goals for recovery and establish a plan to achieve them.  Participation Level:  Active  Participation Quality:  Attentive, Monopolizing and Sharing  Affect:  Appropriate  Cognitive:  Disorganized  Insight: Improving  Engagement in Group:  Developing/Improving and Improving  Modes of Intervention:  Discussion, Education, Limit-setting, Socialization and Support  Additional Comments:  [name] attended group and participated during group. Patient discussed one good thing that was going good with patient day. Patient shared and followed along in patient workbook about what patient would like to change to stay in the stage of recovery. Patient stated a change and setting a goal for recovery. During discussion also shared coping skills to have when in a stage of relapsing. Patient stated how he wanted to get his medication managed while he is here in hospital.  Karleen HampshireFox, Marine Lezotte Brittini 03/26/2013, 6:09 PM

## 2013-03-26 NOTE — Progress Notes (Signed)
Patient ID: Donald KarvonenCraig Orozco, male   DOB: 02/25/1966, 48 y.o.   MRN: [id number]  D: Pt started of the conversation stating that the dr "got upset with him" because pt told the dr that his meds weren't working. However during the course of the conversation the writer asked pt to explain his comment. Pt attempted to explain but writer was unable to follow the conversation. Pt began to say that they dr thought he was suicidal. Stated that he told the dr "he wasn't suicidal he was just be in a suicidal rage". Stated that a suicidal rage is where you fight and it doesn't matter whether you get hurt or not.   A:  Support and encouragement was offered. 15 min checks continued for safety.  R: Pt remains safe.

## 2013-03-26 NOTE — Progress Notes (Signed)
D: Pt in bed resting with eyes closed. Respirations even and unlabored. Pt appears to be in no signs of distress at this time. A: Q15min checks remains for this pt. R: Pt remains safe at this time.   
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
## 2013-03-26 NOTE — Progress Notes (Signed)
Adult Psychoeducational Group Note  Date:  03/26/2013 Time:  8:00 pm  Group Topic/Focus:  Wrap-Up Group:   The focus of this group is to help patients review their daily goal of treatment and discuss progress on daily workbooks.  Participation Level:  Active  Participation Quality:  Appropriate and Sharing  Affect:  Appropriate  Cognitive:  Appropriate  Insight: Appropriate  Engagement in Group:  Engaged  Modes of Intervention:  Discussion, Education, Socialization and Support  Additional Comments:  Pt stated that his name was "[place]" during the group. Pt stated that he loves people and loves entertainment when asked to share two positive character traits about himself.   Laural BenesJohnson, Dason Mosley 03/26/2013, 10:34 PM

## 2013-03-26 NOTE — Progress Notes (Signed)
D: Pt presents with flight of ideas, racing thoughts, rapid pressured speech, brief eye contact and disorganized thoughts. Pt irritable this morning and easily agitated. Pt reports having 1-2 hours of sleep last night because he is not on the right sleeping med. Per pt, he usually takes 150 mg of trazodone of bedtime or self medicate using alcohol and tylenol pm. Pt endorses auditory hallucinations all the time and stated that he often talks to himself. Pt anxious during treatment team this morning, shaking leg, brief eye contact and occasionally putting his finger to his mouth while talking. A: Medications reviewed with pt, MD and Clinical research associatewriter. Medications administered as ordered per MD. Verbal support given. Pt encouraged to attend groups. 15 minute checks performed for safety. R: Pt safety maintained at this time.
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
## 2013-03-26 NOTE — BHH Counselor (Signed)
Adult Psychosocial Assessment Update Interdisciplinary Team  [facility]  Admissions Discharges  Date: 12/01/12 Date:  Date: Date:  Date: Date:  Date: Date:  Date: Date:   Changes since the last Psychosocial Assessment (including adherence to outpatient mental health and/or substance abuse treatment, situational issues contributing to decompensation and/or relapse). Donald Orozco [facility] at the shelter.  He states he is eligible to return to the shelter this week, and [facility].  He is also focused on an open container charge that requires him to pay and fee and attend a class.  "I'll sell my food stamp card if I have to so that I can pay off that fine."  He admits to non-compliance with meds and alcohol consumption.             Discharge Plan 1. Will you be returning to the same living situation after discharge?   Yes:X  Homeless in [place] No:      If no, what is your plan?           2. Would you like a referral for services when you are discharged? Yes: X   If yes, for what services?  No:       [facility]       Summary and Recommendations (to be completed by the evaluator) Donald Orozco is a 48 YO homeless Caucasian male with a diagnosis of bipolar d/o who has had a record of sporadic employment, including demolition and short order cook.  He plans to get into a shelter for the rest of the winter and seek employment again.  [place] homeless services well [place].  He can benefit from crises stabilization, medication management, therapeutic milieu and referral for services.                       Signature:  Ida Rogueorth, Dilon Lank B, 03/26/2013 3:02 PM

## 2013-03-27 NOTE — Progress Notes (Signed)
Patient ID: Donald Orozco, male   DOB: 17-Apr-1965, 48 y.o.   MRN: [id number]  D: Pt attempted to explain where he wanted to go after discharge, however the writer found it difficult to follow. Eventually, pt did explain that he wants to go to [facility]. Stated that theres a bed available for him, if he's able to complete the survey. Pt also requested an antibiotic for his congestion.   A:  Support and encouragement was offered. 15 min checks continued for safety.  R: Pt remains safe.

## 2013-03-27 NOTE — Progress Notes (Signed)
NUTRITION ASSESSMENT  Pt identified as at risk on the Malnutrition Screen Tool  INTERVENTION: 1. Educated patient on the importance of nutrition and encouraged intake of food and beverages. 2. Discussed weight goals. 3. Supplements: none at this time  NUTRITION DIAGNOSIS: Unintentional weight loss related to sub-optimal intake as evidenced by pt report.   Goal: Pt to meet >/= 90% of their estimated nutrition needs.  Monitor:  PO intake  Assessment:  Patient admitted with bipolar-manic.  Homeless and was living at a shelter.  Patient reports good intake currently but poor prior to admit secondary to homeless.  Weight loss from 175 lbs secondary to homelessness and increased walking.    48 y.o. male  Height: Ht Readings from Last 1 Encounters:  03/25/13 5\' 8"  (1.727 m)    Weight: Wt Readings from Last 1 Encounters:  03/25/13 166 lb (75.297 kg)    Weight Hx: Wt Readings from Last 10 Encounters:  03/25/13 166 lb (75.297 kg)  03/23/13 171 lb (77.565 kg)  12/02/12 171 lb (77.565 kg)  12/01/12 175 lb (79.379 kg)    BMI:  Body mass index is 25.25 kg/(m^2). Pt meets criteria for overweight based on current BMI.  Estimated Nutritional Needs: Kcal: 25-30 kcal/kg Protein: > 1 gram protein/kg Fluid: 1 ml/kcal  Diet Order: General Pt is also offered choice of unit snacks mid-morning and mid-afternoon.  Pt is eating as desired.   Lab results and medications reviewed.   Oran ReinLaura Jobe, RD, LDN Clinical Inpatient Dietitian Pager:  [phone] Weekend and after hours pager:  [phone]

## 2013-03-27 NOTE — Progress Notes (Signed)
[facility] MD Progress Note  03/27/2013 11:13 AM Donald Orozco  MRN:  [id number] Subjective: "I was having suicide rage at the shelter, they called the police who brought me to the hospital.'' Objective: Patient reports ongoing racing thoughts and auditory hallucinations. He mood is euphoric, he is hyperverbal, tangential with flight of ideas. He denies homicidal thoughts but still reporting passive suicidal thoughts. He states that he slept better last night and likes his current medication regimen. He denies any adverse reactions to his medications. 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   HENT: Negative.   Eyes: Negative.   Respiratory: Negative.   Cardiovascular: Negative.   Gastrointestinal: Negative.   Genitourinary: Negative.   Musculoskeletal: Negative.   Skin: Negative.   Neurological: Negative.   Endo/Heme/Allergies: Negative.   Psychiatric/Behavioral: Positive for suicidal ideas and substance abuse. The patient is nervous/anxious and has insomnia.     Blood pressure 138/88, pulse 83, temperature 97.5 F (36.4 C), temperature source Oral, resp. rate 16, height 5\' 8"  (1.727 m), weight 75.297 kg (166 lb).Body mass index is 25.25 kg/(m^2).  General Appearance: Disheveled  Eye Contact::  Minimal  Speech:  Pressured  Volume:  Increased   Mood:  Euphoric  Affect:  Labile and Full Range  Thought Process:  Disorganized  Orientation:  Full (Time, Place, and Person)  Thought Content:  Delusions and Hallucinations: Auditory  Suicidal Thoughts:  Yes.  without intent/plan  Homicidal Thoughts:  No  Memory:  Immediate;   Fair Recent;   Fair Remote;   Fair  Judgement:  Poor  Insight:  Lacking  Psychomotor Activity:  Increased  Concentration:  Fair  Recall:  Fair  Akathisia:  No  Handed:  Right  AIMS (if indicated):     Assets:  Architect Physical Health  Sleep:  Number of Hours: 6   Current Medications: Current Facility-Administered Medications  Medication Dose Route Frequency Provider Last Rate Last Dose  . acetaminophen (TYLENOL) tablet 650 mg  650 mg Oral Q6H PRN Meghan Blankmann, NP      . alum & mag hydroxide-simeth (MAALOX/MYLANTA) 200-200-20 MG/5ML suspension 30 mL  30 mL Oral Q4H PRN Meghan Blankmann, NP      . carbamazepine (TEGRETOL) tablet 600 mg  600 mg Oral BID PC Javid Kemler   600 mg at 03/27/13 0751  . dextromethorphan-guaiFENesin (MUCINEX DM) 30-600 MG per 12 hr tablet 1 tablet  1 tablet Oral BID PRN Fransisca Kaufmann, NP   1 tablet at 03/27/13 0753  . hydrOXYzine (ATARAX/VISTARIL) tablet 50 mg  50 mg Oral TID PRN Levis Nazir      . ibuprofen (ADVIL,MOTRIN) tablet 600 mg  600 mg Oral Q8H PRN Meghan Blankmann, NP      . LORazepam (ATIVAN) tablet 1 mg  1 mg Oral Q6H PRN Ndeye Tenorio      . magnesium hydroxide (MILK OF MAGNESIA) suspension 30 mL  30 mL Oral Daily PRN Meghan Blankmann, NP      . nicotine (NICODERM CQ - dosed in mg/24 hours) patch 21 mg  21 mg Transdermal Daily Meghan Blankmann, NP   21 mg at 03/27/13 0752  . OLANZapine zydis (ZYPREXA) disintegrating tablet 10 mg  10 mg Oral Q8H PRN Viola Placeres      . traZODone (DESYREL) tablet 150 mg  150 mg Oral QHS Kaizley Aja   150 mg at 03/26/13 2231    Lab Results: No results found for this or any  previous visit (from the past 48 hour(s)).  Physical Findings: AIMS: Facial and Oral Movements Muscles of Facial Expression: None, normal Lips and Perioral Area: None, normal Jaw: None, normal Tongue: None, normal,Extremity Movements Upper (arms, wrists, hands, fingers): None, normal Lower (legs, knees, ankles, toes): None, normal, Trunk Movements Neck, shoulders, hips: None, normal, Overall Severity Severity of abnormal movements (highest score from questions above): None, normal Incapacitation due to abnormal movements: None, normal Patient's awareness of abnormal movements (rate only patient's report): No Awareness, Dental Status Current problems with teeth and/or dentures?: No Does patient usually wear dentures?: No  CIWA:  CIWA-Ar Total: 2 COWS:     Treatment Plan Summary: Daily contact with patient to assess and evaluate symptoms and progress in treatment Medication management  Plan:1. Admit for crisis management and stabilization. 2. Medication management to reduce current symptoms to base line and improve the     patient's overall level of functioning 3. Treat health problems as indicated. 4. Develop treatment plan to decrease risk of relapse upon discharge and the need for     readmission. 5. Psycho-social education regarding relapse prevention and self care. 6. Health care follow up as needed for medical problems. 7. Restart home medications where appropriate.   Medical Decision Making Problem Points:  Established problem, worsening (2), Review of last therapy session (1) and Review of psycho-social stressors (1) Data Points:  Order Aims Assessment (2) Review of medication regiment & side effects (2) Review of new medications or change in dosage (2)  I certify that inpatient services furnished can reasonably be expected to improve the patient's condition.   Yeng Perz,MD 03/27/2013, 11:13 AM

## 2013-03-27 NOTE — [facility] Group Notes (Signed)
[facility] Note   03/27/2013 10:43 AM  Participation Quality:  Engaged  Mood/Affect:  Excited  Depression Rating:  denies  Anxiety Rating:  5  Thoughts of Suicide:  No Will you contract for safety?   NA  Current AVH:  No  Plan for Discharge/Comments:  [facility]  Pt focused on getting into shelter.  States he slept "OK" last night.  "I wake up whenever they open the door and shine the light.  I guess I am always on alert because usually a flashlight at night means I need to move from where I am sleeping."  Talked about carrying his sleeping bag with him and settling down for the night at a new place rather than going back to same place.  Also said he does not fit in well with homeless population as they tend to team up and drink and smoke together.  Transportation Means: feet/bus  Supports: people who feed the homeless  [place], [place]ney B

## 2013-03-27 NOTE — Progress Notes (Signed)
The focus of this group is to help patients review their daily goal of treatment and discuss progress on daily workbooks. Pt attended the evening group session and responded to all discussion prompts from the Writer. Pt shared that today was a good day on the unit, the highlight of which was possibly being able to discharge to a church program he had been hoping to get into. Pt had difficulty in group not holding side conversations while others were speaking and required redirection several times. Pt's affect was otherwise appropriate.

## 2013-03-27 NOTE — Progress Notes (Signed)
D: Pt presents with rapid pressured speech, flight of ideas, disorganized thoughts, and anxious this morning. Pt denies  SI/AVH but stated that he was trying to hurt someone at the shelter for trying to steal his belongings. Pt has difficulty concentrating and staying on topic when discussing pt progress and treatment plan during shift assessment. Pt has poor insight and poor judgement. Pt compliant with taking meds and attending groups. A: Medications administered as ordered per MD. Verbal support given. Pt encouraged to attend groups. 15 minute checks performed for safety. R: Pt safety  Maintained.

## 2013-03-27 NOTE — [facility] Group Notes (Signed)
[facility] Group Therapy  03/27/2013  1:10 PM  Type of Therapy:  [employer] Presentation   Participation Level:  Active  Participation Quality:  Appropriate  Affect:  Appropriate  Cognitive:  Appropriate  Insight:  Engaged  Engagement in Therapy:  Engaged  Modes of Intervention:  Discussion, Education and Socialization   Summary of Progress/Problems:  Onalee HuaDavid from [employer] came to present his recovery story and play the guitar.  Donald CatchingsCraig listened quietly to the speaker.  Donald Orozco asked the speaker about [facility], church services, and peer support specialist.  He told the speaker that he enjoyed the music and thanked him for coming.    Donald Orozco   03/27/2013  1:10 PM

## 2013-03-28 MED ORDER — SALINE SPRAY 0.65 % NA SOLN
1.0000 | NASAL | Status: DC | PRN
  Administered 2013-03-28: 1 via NASAL
  Filled 2013-03-28: qty 44

## 2013-03-28 NOTE — Progress Notes (Signed)
[facility] Discharge Plan :  Will you be returning to the same living situation after discharge: Yes,  streets [place] At discharge, do you have transportation home?:Yes,  bus pass Do you have the ability to pay for your medications:Yes,  mental health  Release of information consent forms completed and in the chart;  Patient's signature needed at discharge.  Patient to Follow up at: Follow-up Information   Follow up with [facility]. (Go to the walk-in clinic M-F between 8 and 9AM for your hospital follow up appointment)    Contact information:   [address]  [place]  [phone]      Patient denies SI/HI:   Yes,  yes    Safety Planning and Suicide Prevention discussed:  Yes,  yes  Ida Rogueorth, Keelynn Furgerson B 03/28/2013, 4:15 PM

## 2013-03-28 NOTE — [facility] Suicide Risk Assessment (Signed)
[facility] INPATIENT:  Family/Significant Other Suicide Prevention Education  Suicide Prevention Education:  Education Completed; No one has been identified by the patient as the family member/significant other with whom the patient will be residing, and identified as the person(s) who will aid the patient in the event of a mental health crisis (suicidal ideations/suicide attempt).  With written consent from the patient, the family member/significant other has been provided the following suicide prevention education, prior to the and/or following the discharge of the patient.  The suicide prevention education provided includes the following:  Suicide risk factors  Suicide prevention and interventions  National Suicide Hotline telephone number  [facility] assessment telephone number  [place] [facility] Emergency Assistance 911  [facility] and/or Residential Mobile Crisis Unit telephone number  Request made of family/significant other to:  Remove weapons (e.g., guns, rifles, knives), all items previously/currently identified as safety concern.    Remove drugs/medications (over-the-counter, prescriptions, illicit drugs), all items previously/currently identified as a safety concern.  The family member/significant other verbalizes understanding of the suicide prevention education information provided.  The family member/significant other agrees to remove the items of safety concern listed above. The patient did not endorse SI at the time of admission, nor did the patient c/o SI during the stay here.  SPE not required. During the assessment process, Donald CatchingsCraig used the word suicide several times, but each he clarified that he meant he becomes angry and is in danger of hurting someone else, not himself.   Donald Orozco, Donald Orozco 03/28/2013, 5:11 PM

## 2013-03-28 NOTE — Progress Notes (Signed)
Psychoeducational Group Note  Date:  03/28/2013 Time:  1100  Group Topic/Focus:  Healthy Communication:   The focus of this group is to discuss communication, barriers to communication, as well as healthy ways to communicate with others.  Participation Level: Did Not Attend  Participation Quality:  Not Applicable  Affect:  Not Applicable  Cognitive:  Not Applicable  Insight:  Not Applicable  Engagement in Group: Not Applicable  Additional Comments:  Did not attend  Damani Rando A 03/28/2013, 12:52 PM

## 2013-03-28 NOTE — Progress Notes (Addendum)
Patient ID: Norva KarvonenCraig Rydberg, male   DOB: 1965-09-12, 48 y.o.   MRN: [id number]  D: Pt. Denies SI/HI and A/V Hallucinations. Patient rates his depression and hopelessness at 0/10 for the day. Patient reports that after discharge he will be, "trying to get a roof over my head." Patient does not report any pain but does report congestion. Patient requested PRN Mucinex and PRN saline nasal spray.   A: Support and encouragement provided to the patient. Scheduled medications given to patient per physician's orders. PRN Mucinex  And nasal spray given to patient for congestion. Writer encouraged patient to do daily inventory sheet. Patient reported that he couldn't because patient could not see due to not having his glasses on the unit. Writer spoke with patient and was able to fill patient's daily self inventory out while pt. provided the answers. Patient continues to express [facility] get [facility] although, per CSW, patient has not filled out the necessary paper work. Patient had conversation with MD and writer to clarify d/c plans. Patient verbalized understanding of this. Patient also asked about getting his boot socks out of the locker to wash. Writer educated [facility] about [facility] and expressed that they were not allowed on the unit due to a possible choking hazard (socks made into a noose). Patient verbalized understanding of this as well.  R: Patient is receptive and cooperative. Patient expressed relief from congestion with PRN Mucinex and saline nasal spray. Patient is still experiencing pressured speech and is tangential.  Q15 minute checks are maintained for safety.

## 2013-03-28 NOTE — B[facility] Group Notes (Signed)
[facility] Group Notes:  (Counselor/Nursing/MHT/Case Management/Adjunct)  03/28/2013 1:15PM  Type of Therapy:  Group Therapy  Participation Level:  Active  Participation Quality:  Appropriate  Affect:  Flat  Cognitive:  Oriented  Insight:  Improving  Engagement in Group:  Limited  Engagement in Therapy:  Limited  Modes of Intervention:  Discussion, Exploration and Socialization  Summary of Progress/Problems: The topic for group was balance in life.  Pt participated in the discussion about when their life was in balance and out of balance and how this feels.  Pt discussed ways to get back in balance and short term goals they can work on to get where they want to be. Donald Orozco identified needing his medication for sleep and slowing his thoughts down as important things that keep him balanced. Others pointed out to him that his "attitude of gratitude" is something that will take him far, and has helped him find balance.   Donald Orozco, Donald Orozco 03/28/2013 1:19 PM

## 2013-03-28 NOTE — Progress Notes (Signed)
D: Pt denies SI/HI/AVH. Pt is pleasant and cooperative. Pt presents with hypo-manic behavior, but is redirectable. Pt continues to be agitated about people stealing his stuff at the shelter and is upset about not being D/C'd earlier because pt states he lost a bed at another facility. Pt stated that he was going to be homeless because of not getting a bed.   A: Pt was offered support and encouragement. Pt was given scheduled medications. Pt was encourage to attend groups. Q 15 minute checks were done for safety.   R:Pt attends groups and interacts well with peers and staff. Pt has no complaints at this time.Pt receptive to treatment and safety maintained on unit.

## 2013-03-28 NOTE — Progress Notes (Signed)
Patient ID: Donald Orozco, male   DOB: 15-Sep-1965, 48 y.o.   MRN: [id number] [facility] MD Progress Note  03/28/2013 2:39 PM Donald Orozco  MRN:  [id number] Subjective: Patient states "I lost my bed at the shelter because I was here. I need to leave today if I can get another bed. My problem is being homeless not my mental health. You would go wild as well if somebody tried to steal your stuff. How can you be healthy if you have nowhere to stay." Donald Orozco kept talking to Clinical research associate about his housing concerns even after the conversation was finished and this Provider was assessing another patient.   Objective:  Patient is visible on the unit today. He continues to experience racing thoughts, flight of ideas and presents with a very irritable mood. The patient was not able to focus on this writer's daily assessment questions because of his anxiety surrounding his housing arrangement. Donald Orozco has poor insight into his Bipolar Disorder using the issues that come with being homeless to defend his actions such as threatening others at the homeless shelter. The patient was noted to become increasingly agitated as Clinical research associate tried to explain the concerns that treatment team has for his physical and mental well being. Patient continues to be compliant with his medications and denies any untoward effects. Social work Haematologist reported that patient did not have bed at shelter as reported and that he has not yet filled out the necessary paper work.   Diagnosis:   DSM5: Schizophrenia Disorders:  Delusional Disorder (297.1) and Brief Psychotic Disorder (298.8) Obsessive-Compulsive Disorders:   Trauma-Stressor Disorders:   Substance/Addictive Disorders:  Alcohol Intoxication with Use Disorder - Severe (F10.229) Depressive Disorders:  Disruptive Mood Dysregulation Disorder (296.99)  Axis I: Bipolar 1 disorder recent episode manic with psychosis            Alcohol use disorder severe  ADL's:  Intact  Sleep: Fair  Appetite:   Fair  Suicidal Ideation: denies  Plan:  denies Intent:  denies Means:  denies Homicidal Ideation: denies  AEB (as evidenced by):  Psychiatric Specialty Exam: Review of Systems  Constitutional: Negative.   HENT: Positive for congestion.   Eyes: Negative.   Respiratory: Negative.   Cardiovascular: Negative.   Gastrointestinal: Negative.   Genitourinary: Negative.   Musculoskeletal: Negative.   Skin: Negative.   Neurological: Negative.   Endo/Heme/Allergies: Negative.   Psychiatric/Behavioral: Positive for suicidal ideas and substance abuse. The patient is nervous/anxious and has insomnia.     Blood pressure 124/74, pulse 84, temperature 97.9 F (36.6 C), temperature source Oral, resp. rate 20, height 5\' 8"  (1.727 m), weight 75.297 kg (166 lb).Body mass index is 25.25 kg/(m^2).  General Appearance: Disheveled  Eye Contact::  Minimal  Speech:  Pressured  Volume:  Increased  Mood:  Euphoric  Affect:  Labile and Full Range  Thought Process:  Tangential  Orientation:  Full (Time, Place, and Person)  Thought Content:  Delusions and Hallucinations: Auditory  Suicidal Thoughts:  No  Homicidal Thoughts:  No  Memory:  Immediate;   Fair Recent;   Fair Remote;   Fair  Judgement:  Poor  Insight:  Lacking  Psychomotor Activity:  Increased  Concentration:  Fair  Recall:  Fair  Akathisia:  No  Handed:  Right  AIMS (if indicated):     Assets:  Architect Physical Health  Sleep:  Number of Hours: 6.75   Current Medications: Current Facility-Administered Medications  Medication Dose Route Frequency Provider Last Rate Last  Dose  . acetaminophen (TYLENOL) tablet 650 mg  650 mg Oral Q6H PRN Meghan Blankmann, NP      . alum & mag hydroxide-simeth (MAALOX/MYLANTA) 200-200-20 MG/5ML suspension 30 mL  30 mL Oral Q4H PRN Meghan Blankmann, NP      . carbamazepine (TEGRETOL) tablet 600 mg  600 mg Oral BID PC Mojeed Akintayo   600 mg at 03/28/13 0837   . dextromethorphan-guaiFENesin (MUCINEX DM) 30-600 MG per 12 hr tablet 1 tablet  1 tablet Oral BID PRN Fransisca KaufmannLaura Justyn Boyson, NP   1 tablet at 03/28/13 [phone]  . hydrOXYzine (ATARAX/VISTARIL) tablet 50 mg  50 mg Oral TID PRN Mojeed Akintayo      . ibuprofen (ADVIL,MOTRIN) tablet 600 mg  600 mg Oral Q8H PRN Meghan Blankmann, NP      . LORazepam (ATIVAN) tablet 1 mg  1 mg Oral Q6H PRN Mojeed Akintayo      . magnesium hydroxide (MILK OF MAGNESIA) suspension 30 mL  30 mL Oral Daily PRN Meghan Blankmann, NP      . nicotine (NICODERM CQ - dosed in mg/24 hours) patch 21 mg  21 mg Transdermal Daily Meghan Blankmann, NP   21 mg at 03/28/13 0802  . OLANZapine zydis (ZYPREXA) disintegrating tablet 10 mg  10 mg Oral Q8H PRN Mojeed Akintayo      . sodium chloride (OCEAN) 0.65 % nasal spray 1 spray  1 spray Each Nare PRN Mojeed Akintayo   1 spray at 03/28/13 1035  . traZODone (DESYREL) tablet 150 mg  150 mg Oral QHS Mojeed Akintayo   150 mg at 03/27/13 2118    Lab Results: No results found for this or any previous visit (from the past 48 hour(s)).  Physical Findings: AIMS: Facial and Oral Movements Muscles of Facial Expression: None, normal Lips and Perioral Area: None, normal Jaw: None, normal Tongue: None, normal,Extremity Movements Upper (arms, wrists, hands, fingers): None, normal Lower (legs, knees, ankles, toes): None, normal, Trunk Movements Neck, shoulders, hips: None, normal, Overall Severity Severity of abnormal movements (highest score from questions above): None, normal Incapacitation due to abnormal movements: None, normal Patient's awareness of abnormal movements (rate only patient's report): No Awareness, Dental Status Current problems with teeth and/or dentures?: No Does patient usually wear dentures?: No  CIWA:  CIWA-Ar Total: 2 COWS:     Treatment Plan Summary: Daily contact with patient to assess and evaluate symptoms and progress in treatment Medication management  Plan:1. Continue  crisis management and stabilization. 2. Medication management to reduce current symptoms to base line and improve the  patient's overall level of functioning. Continue Tegretol 600 mg BID for improved stability of mood, Ativan 1 mg every six hours as needed for symptoms of alcohol withdrawal, Vistaril 50 mg TID prn for anxiety.  3. Treat health problems as indicated. Continue Mucinex DM 1 tab BID prn cough/congestion.  4. Develop treatment plan to decrease risk of relapse upon discharge and the need for readmission. 5. Psycho-social education regarding relapse prevention and self care. 6. Health care follow up as needed for medical problems. 7. Restart home medications where appropriate.  Medical Decision Making Problem Points:  Established problem, worsening (2), Review of last therapy session (1) and Review of psycho-social stressors (1) Data Points:  Review or order clinical lab tests (1) Review of medication regiment & side effects (2)  I certify that inpatient services furnished can reasonably be expected to improve the patient's condition.   Fransisca KaufmannDAVIS, Kalianne Fetting, NP-C 03/28/2013, 2:39 PM

## 2013-03-28 NOTE — Progress Notes (Signed)
Adult Psychoeducational Group Note  Date:  03/28/2013 Time:  10:19 PM  Group Topic/Focus:  Wrap-Up Group:   The focus of this group is to help patients review their daily goal of treatment and discuss progress on daily workbooks.  Participation Level:  Active  Participation Quality:  Appropriate  Affect:  Appropriate  Cognitive:  Alert  Insight: Appropriate  Engagement in Group:  Engaged  Modes of Intervention:  Discussion  Additional Comments:    Flonnie HailstoneCOOKE, Jazira Maloney R 03/28/2013, 10:19 PM

## 2013-03-28 NOTE — Tx Team (Signed)
  Interdisciplinary Treatment Plan Update   Date Reviewed:  03/28/2013  Time Reviewed:  4:11 PM  Progress in Treatment:   Attending groups: Yes Participating in groups: Yes Taking medication as prescribed: Yes  Tolerating medication: Yes Family/Significant other contact made: Yes  Patient understands diagnosis: Yes  Discussing patient identified problems/goals with staff: Yes Medical problems stabilized or resolved: Yes Denies suicidal/homicidal ideation: Yes Patient has not harmed self or others: Yes  For review of initial/current patient goals, please see plan of care.  Estimated Length of Stay:  [place] d/c tomorrow  Reason for Continuation of Hospitalization:   New Problems/Goals identified:  N/A  Discharge Plan or Barriers:   return to the streets of [place], follow up outpt  Additional Comments:  Attendees:  Signature: Thedore MinsMojeed Akintayo, MD 03/28/2013 4:11 PM   Signature: Richelle Itood Pheonix Wisby, LCSW 03/28/2013 4:11 PM  Signature: Fransisca KaufmannLaura Davis, NP 03/28/2013 4:11 PM  Signature: Joslyn Devonaroline Beaudry, RN 03/28/2013 4:11 PM  Signature: Liborio NixonPatrice White, RN 03/28/2013 4:11 PM  Signature:  03/28/2013 4:11 PM  Signature:   03/28/2013 4:11 PM  Signature:    Signature:    Signature:    Signature:    Signature:    Signature:      Scribe for Treatment Team:   Richelle Itood Trenia Tennyson, LCSW  03/28/2013 4:11 PM

## 2013-03-28 NOTE — Progress Notes (Signed)
Patient ID: Donald Orozco, male   DOB: January 21, 1966, 48 y.o.   MRN: [id number]  Morning Wellness Group 10:00 AM  The focus of this group is to educate the patient on the purpose and policies of crisis stabilization and provide a format to answer questions about their admission.  The group details unit policies and expectations of patients while admitted.   Patient attended group and participated in the discussion of leisure and life style changes. Patient reported that he likes to roller blade and play tennis in order to relax. However, patient became tangential about his housing arrangements. Patient received a leisure and lifestyle packet.

## 2013-03-29 MED ORDER — CARBAMAZEPINE 200 MG PO TABS: 600.0000 mg | ORAL_TABLET | Freq: Two times a day (BID) | ORAL | Status: DC

## 2013-03-29 MED ORDER — HYDROXYZINE HCL 25 MG PO TABS
25.0000 mg | ORAL_TABLET | Freq: Three times a day (TID) | ORAL | Status: DC | PRN
  Filled 2013-03-29 (×2): qty 30

## 2013-03-29 MED ORDER — TRAZODONE HCL 150 MG PO TABS: 150.0000 mg | ORAL_TABLET | Freq: Every day | ORAL | Status: DC

## 2013-03-29 NOTE — [facility] Group Notes (Signed)
[facility] LCSW Group Therapy  03/29/2013 1:37 PM  Type of Therapy:  Group Therapy   Participation Level:  Active  Participation Quality:  Appropriate  Affect:  Excited  Cognitive:  Disorganized  Insight:  Off Topic  Engagement in Therapy:  Off Topic  Modes of Intervention:  Discussion and Socialization   Summary of Progress/Problems:  Chaplain was here to lead a group on themes of hope and courage. Donald Orozco presented as disorganized, but less distracting in group and responded to questions in appropriate times.  Donald Orozco asked the Chaplain if he was in the [facility] program and if he can connect him to housing.  He indicated that he is connected to the church community.   He stated that he stays motivated by running and having a routine.  He indicated that one of the reasons  he decided to stay an extra day was because he didn't want to be surrounded with the party scene on Friday nights.    Donald Orozco   03/29/2013  1:37 PM
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
## 2013-03-29 NOTE — Progress Notes (Signed)
Psychoeducational Group Note  Date:  03/29/2013 Time:  8:00 p.m.   Group Topic/Focus:  Wrap-Up Group:   The focus of this group is to help patients review their daily goal of treatment and discuss progress on daily workbooks.  Participation Level: Did Not Attend  Participation Quality:  Not Applicable  Affect:  Not Applicable  Cognitive:  Not Applicable  Insight:  Not Applicable  Engagement in Group: Not Applicable  Additional Comments:  The patient did not attend group this evening since he was sleeping in his bedroom.   Hazle CocaGOODMAN, Kamdyn Covel S 03/29/2013, 10:18 PM

## 2013-03-29 NOTE — Progress Notes (Signed)
Adult Psychoeducational Group Note  Date:  03/29/2013 Time:  12:21 PM  Group Topic/Focus:  Early Warning Signs:   The focus of this group is to help patients identify signs or symptoms they exhibit before slipping into an unhealthy state or crisis.  Participation Level:  Minimal  Participation Quality:  Attentive  Affect:  Flat  Cognitive:  Alert and Lacking  Insight: Lacking  Engagement in Group:  Engaged  Modes of Intervention:  Activity, Discussion, Education, Exploration, Socialization and Support  Additional Comments:  Pt came to group and shared that he did not have any early warning signs for relapse, but he did need to stay away from certain people at the shelters that upset him and that he was here to get on the right medications.   Cathlean CowerClouse, Donald Orozco 03/29/2013, 12:21 PM

## 2013-03-29 NOTE — Progress Notes (Signed)
Patient ID: Donald Orozco, male   DOB: 31-Jul-1965, 48 y.o.   MRN: [id number] [facility]  03/29/2013 11:55 AM Donald Orozco  MRN:  [id number] Subjective: "I am doing well today, I will have a bed available at the shelter on Saturday."" Objective: Patient reports decreased mood swings, racing thoughts and auditory hallucinations.  He denies homicidal thoughts but still reporting passive suicidal thoughts. He states that he is sleeping much better. He is compliant with his medications and denies any adverse reactions. 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   HENT: Negative.   Eyes: Negative.   Respiratory: Negative.   Cardiovascular: Negative.   Gastrointestinal: Negative.   Genitourinary: Negative.   Musculoskeletal: Negative.   Skin: Negative.   Neurological: Negative.   Endo/Heme/Allergies: Negative.   Psychiatric/Behavioral: The patient is nervous/anxious.     Blood pressure 147/92, pulse 80, temperature 97.9 F (36.6 C), temperature source Oral, resp. rate 18, height 5\' 8"  (1.727 m), weight 75.297 kg (166 lb).Body mass index is 25.25 kg/(m^2).  General Appearance: fairly groomed  Patent attorneyye Contact::  Minimal  Speech:  Pressured  Volume:  Increased  Mood:  Euphoric  Affect:  Full Range  Thought Process:  Disorganized  Orientation:   Full (Time, Place, and Person)  Thought Content:  Delusions  Suicidal Thoughts:  denies  Homicidal Thoughts:  No  Memory:  Immediate;   Fair Recent;   Fair Remote;   Fair  Judgement:  Poor  Insight:  Lacking  Psychomotor Activity:  Increased  Concentration:  Fair  Recall:  Fair  Akathisia:  No  Handed:  Right  AIMS (if indicated):     Assets:  ArchitectCommunication Skills Financial Resources/Insurance Physical Health  Sleep:  Number of Hours: 6.5   Current Medications: Current Facility-Administered Medications  Medication Dose Route Frequency Provider Last Rate Last Dose  . acetaminophen (TYLENOL) tablet 650 mg  650 mg Oral Q6H PRN Meghan Blankmann, NP      . alum & mag hydroxide-simeth (MAALOX/MYLANTA) 200-200-20 MG/5ML suspension 30 mL  30 mL Oral Q4H PRN Meghan Blankmann, NP      . carbamazepine (TEGRETOL) tablet 600 mg  600 mg Oral BID PC Jayme Mednick   600 mg at 03/29/13 0802  . dextromethorphan-guaiFENesin (MUCINEX DM) 30-600 MG per 12 hr tablet 1 tablet  1 tablet Oral BID PRN Fransisca KaufmannLaura Davis, NP   1 tablet at 03/28/13 2257  . hydrOXYzine (ATARAX/VISTARIL) tablet 50 mg  50 mg Oral TID PRN Jaylanie Boschee      . ibuprofen (ADVIL,MOTRIN) tablet 600 mg  600 mg Oral Q8H PRN Meghan Blankmann, NP      . LORazepam (ATIVAN) tablet 1 mg  1 mg Oral Q6H PRN Denorris Reust      . magnesium hydroxide (MILK OF MAGNESIA) suspension 30 mL  30 mL Oral Daily PRN Kendrick FriesMeghan Blankmann, NP      . nicotine (  NICODERM CQ - dosed in mg/24 hours) patch 21 mg  21 mg Transdermal Daily Meghan Blankmann, NP   21 mg at 03/29/13 0810  . OLANZapine zydis (ZYPREXA) disintegrating tablet 10 mg  10 mg Oral Q8H PRN Mattheo Swindle      . sodium chloride (OCEAN) 0.65 % nasal spray 1 spray  1 spray Each Nare PRN Mariaclara Spear   1 spray at 03/28/13 1035  . traZODone (DESYREL) tablet 150 mg  150 mg Oral QHS Kieran Arreguin   150 mg at 03/28/13 2255    Lab Results: No results found for this or any previous visit (from the  past 48 hour(s)).  Physical Findings: AIMS: Facial and Oral Movements Muscles of Facial Expression: None, normal Lips and Perioral Area: None, normal Jaw: None, normal Tongue: None, normal,Extremity Movements Upper (arms, wrists, hands, fingers): None, normal Lower (legs, knees, ankles, toes): None, normal, Trunk Movements Neck, shoulders, hips: None, normal, Overall Severity Severity of abnormal movements (highest score from questions above): None, normal Incapacitation due to abnormal movements: None, normal Patient's awareness of abnormal movements (rate only patient's report): No Awareness, Dental Status Current problems with teeth and/or dentures?: No Does patient usually wear dentures?: No  CIWA:  CIWA-Ar Total: 2 COWS:     Treatment Plan Summary: Daily contact with patient to assess and evaluate symptoms and progress in treatment Medication management  Plan:1. Admit for crisis management and stabilization. 2. Medication management to reduce current symptoms to base line and improve the     patient's overall level of functioning 3. Treat health problems as indicated. 4. Develop treatment plan to decrease risk of relapse upon discharge and the need for     readmission. 5. Psycho-social education regarding relapse prevention and self care. 6. Health care follow up as needed for medical problems. 7. Restart home medications where appropriate. 8, Continue current medication regimen 9. Tegretol level on 03/30/13.   Medical Decision Making Problem Points:  Established problem, improved (1), Review of last therapy session (1) and Review of psycho-social stressors (1) Data Points:  Order Aims Assessment (2) Review of medication regiment & side effects (2) Review of new medications or change in dosage (2)  I certify that inpatient services furnished can reasonably be expected to improve the patient's condition.   Finnlee Silvernail,MD 03/29/2013, 11:55 AM

## 2013-03-29 NOTE — [facility] Group Notes (Signed)
[facility]   03/29/2013 9:53 AM  Participation Quality:  Engaged  Mood/Affect:  Excited  Depression Rating:  denies  Anxiety Rating:  Unrated   Thoughts of Suicide:  No Will you contract for safety?   NA  Current AVH:  No  Plan for Discharge/Comments:  After many days of being focused on D/C this week,  [name] decided that he would like to stay until tomorrow.  He wanted to go to church on Saturday with people who can help him with his court issue.  Now, he indicated that he would like an extra day so that [facility].  CSW explained that he is allowed to stay an extra day if he does not pester staff about leaving today.        Transportation Means: feet/bus  Supports: people who feed the homeless  Simona HuhYang, Tina

## 2013-03-30 DIAGNOSIS — F10939 Alcohol use, unspecified with withdrawal, unspecified: Secondary | ICD-10-CM

## 2013-03-30 DIAGNOSIS — F10239 Alcohol dependence with withdrawal, unspecified: Secondary | ICD-10-CM

## 2013-03-30 LAB — CARBAMAZEPINE LEVEL, TOTAL: CARBAMAZEPINE LVL: 12.6 ug/mL — AB (ref 4.0–12.0)

## 2013-03-30 NOTE — Progress Notes (Signed)
Patient ID: Donald Orozco, male   DOB: [birth year], 48 y.o.   MRN: [id number] Psychoeducational Group Note  Date:  03/30/2013 Time:1000am  Group Topic/Focus:  Identifying Needs:   The focus of this group is to help patients identify their personal needs that have been historically problematic and identify healthy behaviors to address their needs.  Participation Level:  Active  Participation Quality:  Appropriate  Affect:  Appropriate  Cognitive:  Appropriate  Insight:  Supportive  Engagement in Group:  Supportive  Additional Comments:  Inventory and Psychoeducational group-healthy coping skills.   Donald DavidWeaver, Donald Orozco 03/30/2013,12:27 PM
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
## 2013-03-30 NOTE — Progress Notes (Signed)
Patient ID: Donald Orozco, male   DOB: May 24, 1965, 48 y.o.   MRN: [id number]
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
## 2013-03-30 NOTE — [facility] Suicide Risk Assessment (Signed)
Suicide Risk Assessment  Discharge Assessment     Demographic Factors:  Male and Living alone  Mental Status Per Nursing Assessment::   On Admission:  Suicidal ideation indicated by others  Current Mental Status by Physician: Alert oriented and cooperative. Not psychotic with improved insight. Denies suicidal or homicidal tougths.   Loss Factors: Decrease in vocational status and Financial problems/change in socioeconomic status  Historical Factors: Impulsivity and Alcohol use and poor impulsivity  Risk Reduction Factors:   Religious beliefs about death and Positive therapeutic relationship  Continued Clinical Symptoms:  Dysthymia Alcohol/Substance Abuse/Dependencies Unstable or Poor Therapeutic Relationship Previous Psychiatric Diagnoses and Treatments  Cognitive Features That Contribute To Risk:  Closed-mindedness Polarized thinking    Suicide Risk:  Minimal: No identifiable suicidal ideation.  Patients presenting with no risk factors but with morbid ruminations; may be classified as minimal risk based on the severity of the depressive symptoms  Discharge Diagnoses:   AXIS I:  Alcohol Abuse, Bipolar, Depressed, Substance Induced Mood Disorder and Alcohol use disorder, severe AXIS II:  Deferred AXIS III:   Past Medical History  Diagnosis Date  . Bipolar 1 disorder    AXIS IV:  economic problems, occupational problems, other psychosocial or environmental problems, problems related to social environment and problems with primary support group AXIS V:  51-60 moderate symptoms  Plan Of Care/Follow-up recommendations:  Activity:  as tolerated Diet:  regular. See discharge summary for detail appointments. Continue prescribed medications for home.  Follow up with recommended treatment program, therapy and appointments.   Is patient on multiple antipsychotic therapies at discharge:  No   Has Patient had three or more failed trials of antipsychotic monotherapy by  history:  No  Recommended Plan for Multiple Antipsychotic Therapies: NA  Donald Orozco 03/30/2013, 10:37 AM

## 2013-03-30 NOTE — Progress Notes (Signed)
Patient ID: Donald KarvonenCraig Orozco, male   DOB: [birth year], 48 y.o.   MRN: [id number] Orders received for patients discharge. Patient reports he feels good about discharge stating '' I needed to come in for a few days and get some rest and get my thoughts straight. I'm better now '' Patient denies any SI / HI / A/V Hallucinations. No signs of acute decompensation, pt affect bright. Discharge instructions reviewed at length, AVS signed, copy provided. 14 day supply of medications given,as well as RX for medications. Crisis services reviewed and pt verbalized understanding. All belongings returned. Pt escorted from unit to lobby.

## 2013-03-30 NOTE — Progress Notes (Signed)
D: Pt denies SI/HI/AVH. Pt is pleasant and cooperative. Pt continues to exhibit hypomanic behaviors. Pt slep most of the evening.   A: Pt was offered support and encouragement. Pt was given scheduled medications. Pt was encourage to attend groups. Q 15 minute checks were done for safety.   R: Pt is taking medication. Pt has no complaints at this time.Pt receptive to treatment and safety maintained on unit.

## 2013-04-01 NOTE — Progress Notes (Signed)
  I agreed with findings and treatment plan of this patient Yuniel Blaney, MD 

## 2013-04-03 NOTE — Progress Notes (Signed)
Patient Discharge Instructions:  After Visit Summary (AVS):   Faxed to:  04/03/13 Discharge Summary Note:   Faxed to:  04/03/13 Psychiatric Admission Assessment Note:   Faxed to:  04/03/13 Suicide Risk Assessment - Discharge Assessment:   Faxed to:  04/03/13 Faxed/Sent to the Next Level Care provider:  04/03/13 [facility] @ [phone]   Jerelene ReddenSheena E Manning, 04/03/2013, 3:39 PM

## 2013-04-25 ENCOUNTER — Encounter (HOSPITAL_COMMUNITY): Payer: Self-pay | Admitting: Emergency Medicine

## 2013-04-25 ENCOUNTER — Emergency Department (HOSPITAL_COMMUNITY)
Admission: EM | Admit: 2013-04-25 | Discharge: 2013-04-26 | Disposition: A | Discharge status: Home / Self Care | Payer: Self-pay | Attending: Emergency Medicine | Admitting: Emergency Medicine

## 2013-04-25 ENCOUNTER — Emergency Department (HOSPITAL_COMMUNITY): Payer: Self-pay

## 2013-04-25 DIAGNOSIS — F10929 Alcohol use, unspecified with intoxication, unspecified: Secondary | ICD-10-CM

## 2013-04-25 DIAGNOSIS — F411 Generalized anxiety disorder: Secondary | ICD-10-CM | POA: Insufficient documentation

## 2013-04-25 DIAGNOSIS — R000 Tachycardia, unspecified: Secondary | ICD-10-CM | POA: Insufficient documentation

## 2013-04-25 DIAGNOSIS — Z760 Encounter for issue of repeat prescription: Secondary | ICD-10-CM | POA: Insufficient documentation

## 2013-04-25 DIAGNOSIS — F319 Bipolar disorder, unspecified: Secondary | ICD-10-CM

## 2013-04-25 DIAGNOSIS — Z79899 Other long term (current) drug therapy: Secondary | ICD-10-CM | POA: Insufficient documentation

## 2013-04-25 DIAGNOSIS — F1010 Alcohol abuse, uncomplicated: Secondary | ICD-10-CM | POA: Insufficient documentation

## 2013-04-25 DIAGNOSIS — F1020 Alcohol dependence, uncomplicated: Secondary | ICD-10-CM

## 2013-04-25 DIAGNOSIS — F17200 Nicotine dependence, unspecified, uncomplicated: Secondary | ICD-10-CM | POA: Insufficient documentation

## 2013-04-25 DIAGNOSIS — R45851 Suicidal ideations: Secondary | ICD-10-CM | POA: Insufficient documentation

## 2013-04-25 DIAGNOSIS — F3130 Bipolar disorder, current episode depressed, mild or moderate severity, unspecified: Secondary | ICD-10-CM | POA: Insufficient documentation

## 2013-04-25 LAB — COMPREHENSIVE METABOLIC PANEL
ALT: 19 U/L (ref 0–53)
AST: 26 U/L (ref 0–37)
Albumin: 4.7 g/dL (ref 3.5–5.2)
Alkaline Phosphatase: 60 U/L (ref 39–117)
BUN: 9 mg/dL (ref 6–23)
CO2: 23 mEq/L (ref 19–32)
Calcium: 9.4 mg/dL (ref 8.4–10.5)
Chloride: 102 mEq/L (ref 96–112)
Creatinine, Ser: 0.97 mg/dL (ref 0.50–1.35)
GFR calc Af Amer: >90 mL/min (ref >90)
GFR calc non Af Amer: >90 mL/min (ref >90)
GLUCOSE: 89 mg/dL (ref 70–99)
Potassium: 4 mEq/L (ref 3.7–5.3)
SODIUM: 141 meq/L (ref 137–147)
TOTAL PROTEIN: 7.8 g/dL (ref 6.0–8.3)
Total Bilirubin: 0.3 mg/dL (ref 0.3–1.2)

## 2013-04-25 LAB — CBC
HCT: 45.9 % (ref 39.0–52.0)
Hemoglobin: 15.9 g/dL (ref 13.0–17.0)
MCH: 32.6 pg (ref 26.0–34.0)
MCHC: 34.6 g/dL (ref 30.0–36.0)
MCV: 94.3 fL (ref 78.0–100.0)
PLATELETS: 259 10*3/uL (ref 150–400)
RBC: 4.87 MIL/uL (ref 4.22–5.81)
RDW: 13.2 % (ref 11.5–15.5)
WBC: 12.8 10*3/uL — ABNORMAL HIGH (ref 4.0–10.5)

## 2013-04-25 LAB — RAPID URINE DRUG SCREEN, HOSP PERFORMED
Amphetamines: NOT DETECTED
BARBITURATES: NOT DETECTED
Benzodiazepines: NOT DETECTED
Cocaine: NOT DETECTED
Opiates: NOT DETECTED
TETRAHYDROCANNABINOL: NOT DETECTED

## 2013-04-25 LAB — ETHANOL: Alcohol, Ethyl (B): 254 mg/dL — ABNORMAL HIGH (ref 0–11)

## 2013-04-25 MED ORDER — ZOLPIDEM TARTRATE 5 MG PO TABS: 5.0000 mg | ORAL_TABLET | Freq: Every evening | ORAL | Status: DC | PRN

## 2013-04-25 MED ORDER — LORAZEPAM 1 MG PO TABS: 0.0000 mg | ORAL_TABLET | Freq: Four times a day (QID) | ORAL | Status: DC

## 2013-04-25 MED ORDER — LORAZEPAM 1 MG PO TABS: 1.0000 mg | ORAL_TABLET | Freq: Three times a day (TID) | ORAL | Status: DC | PRN

## 2013-04-25 MED ORDER — THIAMINE HCL 100 MG/ML IJ SOLN: 100.0000 mg | Freq: Every day | INTRAMUSCULAR | Status: DC

## 2013-04-25 MED ORDER — ALUM & MAG HYDROXIDE-SIMETH 200-200-20 MG/5ML PO SUSP: 30.0000 mL | ORAL | Status: DC | PRN

## 2013-04-25 MED ORDER — VITAMIN B-1 100 MG PO TABS
100.0000 mg | ORAL_TABLET | Freq: Every day | ORAL | Status: DC
Start: 2013-04-25 — End: 2013-04-26
  Administered 2013-04-26: 100 mg via ORAL
  Filled 2013-04-25: qty 1

## 2013-04-25 MED ORDER — ONDANSETRON HCL 4 MG PO TABS: 4.0000 mg | ORAL_TABLET | Freq: Three times a day (TID) | ORAL | Status: DC | PRN

## 2013-04-25 MED ORDER — NICOTINE 21 MG/24HR TD PT24: 21.0000 mg | MEDICATED_PATCH | Freq: Every day | TRANSDERMAL | Status: DC

## 2013-04-25 MED ORDER — IBUPROFEN 200 MG PO TABS
600.0000 mg | ORAL_TABLET | Freq: Three times a day (TID) | ORAL | Status: DC | PRN
Start: 2013-04-25 — End: 2013-04-26

## 2013-04-25 MED ORDER — LORAZEPAM 1 MG PO TABS: 0.0000 mg | ORAL_TABLET | Freq: Two times a day (BID) | ORAL | Status: DC

## 2013-04-25 NOTE — ED Notes (Addendum)
Pt.orders documented in error. RN, Beane made aware.

## 2013-04-25 NOTE — ED Notes (Signed)
Pt. In new blue scrubs. Pt. And belongings searched and wanded by security.pt. Has 2 belongings bags and 1 book bag(light pink, black and white). Pt. Has pant, shoes, jacket, boggan,underpants, socks,sweat pants,shirt. Pt. Belongings locked up in TCU locker #43 near the psy-ed.

## 2013-04-25 NOTE — ED Notes (Signed)
Pt called EMS and told them that he was out of his medications and wanted a refill. Pt has ETOH on breath. Pt is calm, cooperative. Pt has steady, independent gait.

## 2013-04-25 NOTE — ED Notes (Signed)
Patient refused to change and to cooperate with staff and unit policy. EDPA notified of patient activity and refusal to cooperate with request. Pt states "I have a ticket out of here, I don't want to be locked away for seven days." Pt instructed that he was free to leave. Pt went to the front door and turned around and came back to triage.

## 2013-04-25 NOTE — ED Provider Notes (Signed)
CSN: [id number]     Arrival date & time 04/25/13  2053 History   First MD Initiated Contact with Patient 04/25/13 2113     Chief Complaint  Patient presents with  . Alcohol Intoxication     (Consider location/radiation/quality/duration/timing/severity/associated sxs/prior Treatment) HPI Comments: Patient is a 48 year old male with a past medical history of alcohol abuse and bipolar disorder who presents to the emergency department requesting a refill on trazodone and Tegretol. Patient states he takes these to sleep and has not been on them for the past 3 months. Also states he is depressed, anxious and psychotic. Denies suicidal or homicidal ideations, hallucinations. Patient is currently obviously intoxicated. He is also complaining of left shoulder pain from when he was arrested by police. Level V caveat.  Patient is a 48 y.o. male presenting with intoxication. The history is provided by the patient. The history is limited by the condition of the patient.  Alcohol Intoxication    Past Medical History  Diagnosis Date  . Bipolar 1 disorder    History reviewed. No pertinent past surgical history. No family history on file. History  Substance Use Topics  . Smoking status: Current Every Day Smoker -- 1.00 packs/day    Types: Cigarettes  . Smokeless tobacco: Never Used  . Alcohol Use: 14.4 oz/week    24 Cans of beer per week     Comment: every day.     Review of Systems  Unable to perform ROS: Other  ETOH intoxication    Allergies  Review of patient's allergies indicates no known allergies.  Home Medications   Current Outpatient Rx  Name  Route  Sig  Dispense  Refill  . carbamazepine (TEGRETOL) 200 MG tablet   Oral   Take 3 tablets (600 mg total) by mouth 2 (two) times daily after a meal.   180 tablet   0   . hydrOXYzine (ATARAX/VISTARIL) 25 MG tablet   Oral   Take 25 mg by mouth 3 (three) times daily as needed for anxiety or itching.         . traZODone  (DESYREL) 150 MG tablet   Oral   Take 1 tablet (150 mg total) by mouth at bedtime.   30 tablet   0    BP 135/80  Pulse 118  Temp(Src) 98 F (36.7 C) (Oral)  Resp 16  SpO2 93% Physical Exam  Nursing note and vitals reviewed. Constitutional: He is oriented to person, place, and time. He appears well-developed and well-nourished. No distress.  Unkempt. Smells of ETOH and cigarettes.  HENT:  Head: Normocephalic and atraumatic.  Mouth/Throat: Oropharynx is clear and moist.  Eyes: Conjunctivae are normal. Pupils are equal, round, and reactive to light.  Neck: Normal range of motion. Neck supple.  Cardiovascular: Regular rhythm and normal heart sounds.  Tachycardia present.   Pulmonary/Chest: Effort normal and breath sounds normal.  Abdominal: Soft. Bowel sounds are normal. There is no tenderness.  Musculoskeletal: Normal range of motion. He exhibits no edema.  Neurological: He is alert and oriented to person, place, and time.  Skin: Skin is dry. He is not diaphoretic.  Psychiatric: His speech is slurred. He expresses no homicidal and no suicidal ideation. He is inattentive.    ED Course  Procedures (including critical care time) Labs Review Labs Reviewed  CBC - Abnormal; Notable for the following:    WBC 12.8 (*)    All other components within normal limits  ETHANOL - Abnormal; Notable for the following:  Alcohol, Ethyl (B) 254 (*)    All other components within normal limits  COMPREHENSIVE METABOLIC PANEL  URINE RAPID DRUG SCREEN (HOSP PERFORMED)   Imaging Review Dg Shoulder Left  04/25/2013   CLINICAL DATA:  Left shoulder pain  EXAM: LEFT SHOULDER - 2+ VIEW  COMPARISON:  None.  FINDINGS: There is no evidence of fracture or dislocation. There are degenerative joint changes of left acromioclavicular joint. Soft tissues are unremarkable.  IMPRESSION: No acute fracture or dislocation. Degenerative joint changes of the acromioclavicular joint.   Electronically Signed   By:  Sherian Rein M.D.   On: 04/25/2013 22:10    EKG Interpretation   None       MDM   Final diagnoses:  Alcohol intoxication  Suicidal ideation    Patient requesting refills on trazodone and Tegretol, currently intoxicated. States he is psychotic, depressed anxious. No SI, HI or hallucinations. Labs pending. CIWA protocol, TTS consult.  Shoulder xray normal.  Pt medically cleared.  Trevor Mace, PA-C 04/26/13 0502  Trevor Mace, PA-C 04/26/13 [phone]

## 2013-04-26 DIAGNOSIS — F3130 Bipolar disorder, current episode depressed, mild or moderate severity, unspecified: Secondary | ICD-10-CM

## 2013-04-26 MED ORDER — TRAZODONE HCL 50 MG PO TABS
150.0000 mg | ORAL_TABLET | Freq: Every day | ORAL | Status: DC
  Filled 2013-04-26: qty 1

## 2013-04-26 MED ORDER — TRAZODONE HCL 150 MG PO TABS: 150.0000 mg | ORAL_TABLET | Freq: Every day | ORAL | Status: AC

## 2013-04-26 MED ORDER — CARBAMAZEPINE 200 MG PO TABS: 600.0000 mg | ORAL_TABLET | Freq: Two times a day (BID) | ORAL | Status: AC

## 2013-04-26 MED ORDER — CARBAMAZEPINE 200 MG PO TABS
600.0000 mg | ORAL_TABLET | Freq: Two times a day (BID) | ORAL | Status: DC
  Filled 2013-04-26 (×2): qty 3

## 2013-04-26 NOTE — Discharge Instructions (Signed)
Alcohol Problems °Most adults who drink alcohol drink in moderation (not a lot) are at low risk for developing problems related to their drinking. However, all drinkers, including low-risk drinkers, should know about the health risks connected with drinking alcohol. °RECOMMENDATIONS FOR LOW-RISK DRINKING  °Drink in moderation. Moderate drinking is defined as follows:  °· Men - no more than 2 drinks per day. °· Nonpregnant women - no more than 1 drink per day. °· Over age 49 - no more than 1 drink per day. °A standard drink is 12 grams of pure alcohol, which is equal to a 12 ounce bottle of beer or wine cooler, a 5 ounce glass of wine, or 1.5 ounces of distilled spirits (such as whiskey, brandy, vodka, or rum).  °ABSTAIN FROM (DO NOT DRINK) ALCOHOL: °· When pregnant or considering pregnancy. °· When taking a medication that interacts with alcohol. °· If you are alcohol dependent. °· A medical condition that prohibits drinking alcohol (such as ulcer, liver disease, or heart disease). °DISCUSS WITH YOUR CAREGIVER: °· If you are at risk for coronary heart disease, discuss the potential benefits and risks of alcohol use: Light to moderate drinking is associated with lower rates of coronary heart disease in certain populations (for example, men over age 49 and postmenopausal women). Infrequent or nondrinkers are advised not to begin light to moderate drinking to reduce the risk of coronary heart disease so as to avoid creating an alcohol-related problem. Similar protective effects can likely be gained through proper diet and exercise. °· Women and the elderly have smaller amounts of body water than men. As a result women and the elderly achieve a higher blood alcohol concentration after drinking the same amount of alcohol. °· Exposing a fetus to alcohol can cause a broad range of birth defects referred to as Fetal Alcohol Syndrome (FAS) or Alcohol-Related Birth Defects (ARBD). Although FAS/ARBD is connected with excessive  alcohol consumption during pregnancy, studies also have reported neurobehavioral problems in infants born to mothers reporting drinking an average of 1 drink per day during pregnancy. °· Heavier drinking (the consumption of more than 4 drinks per occasion by men and more than 3 drinks per occasion by women) impairs learning (cognitive) and psychomotor functions and increases the risk of alcohol-related problems, including accidents and injuries. °CAGE QUESTIONS:  °· Have you ever felt that you should Cut down on your drinking? °· Have people Annoyed you by criticizing your drinking? °· Have you ever felt bad or Guilty about your drinking? °· Have you ever had a drink first thing in the morning to steady your nerves or get rid of a hangover (Eye opener)? °If you answered positively to any of these questions: You may be at risk for alcohol-related problems if alcohol consumption is:  °· Men: Greater than 14 drinks per week or more than 4 drinks per occasion. °· Women: Greater than 7 drinks per week or more than 3 drinks per occasion. °Do you or your family have a medical history of alcohol-related problems, such as: °· Blackouts. °· Sexual dysfunction. °· Depression. °· Trauma. °· Liver dysfunction. °· Sleep disorders. °· Hypertension. °· Chronic abdominal pain. °· Has your drinking ever caused you problems, such as problems with your family, problems with your work (or school) performance, or accidents/injuries? °· Do you have a compulsion to drink or a preoccupation with drinking? °· Do you have poor control or are you unable to stop drinking once you have started? °· Do you have to drink to   avoid withdrawal symptoms? °· Do you have problems with withdrawal such as tremors, nausea, sweats, or mood disturbances? °· Does it take more alcohol than in the past to get you high? °· Do you feel a strong urge to drink? °· Do you change your plans so that you can have a drink? °· Do you ever drink in the morning to relieve  the shakes or a hangover? °If you have answered a number of the previous questions positively, it may be time for you to talk to your caregivers, family, and friends and see if they think you have a problem. Alcoholism is a chemical dependency that keeps getting worse and will eventually destroy your health and relationships. Many alcoholics end up dead, impoverished, or in prison. This is often the end result of all chemical dependency. °· Do not be discouraged if you are not ready to take action immediately. °· Decisions to change behavior often involve up and down desires to change and feeling like you cannot decide. °· Try to think more seriously about your drinking behavior. °· Think of the reasons to quit. °WHERE TO GO FOR ADDITIONAL INFORMATION  °· The National Institute on Alcohol Abuse and Alcoholism (NIAAA) °www.niaaa.nih.gov °· National Council on Alcoholism and Drug Dependence (NCADD) °www.ncadd.org °· American Society of Addiction Medicine (ASAM) °www.asam.org  °Document Released: 02/28/2005 Document Revised: 05/23/2011 Document Reviewed: 10/17/2007 °Exit[employer]® Patient Information ©2014 [employer]. ° °

## 2013-04-26 NOTE — BH Assessment (Signed)
Assessment Note  Donald Orozco is an 48 y.o. male. Patient states that he is here because he needs some emergency medications. 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 Patient states that he is from [place] and that he came up here 6 months ago for a job.  Patient states that he followed up with [facility] after discharge but they would not give him his Tegretol because it was too expensive and that they cut his trazodone dosage in half (I'm suppose to take 300 mg they only gave me 150 mg) patient states that he continued to take 300 mg and ran out of medication.  Patient states that he is only here waiting to get his medications. He does not need or want detox.  Axis I: Bipolar D/O NOS, Alcohol Abuse Axis II: Deferred Axis III:  Past Medical History  Diagnosis Date  . Bipolar 1 disorder    Axis IV: economic problems, housing problems, other psychosocial or environmental problems, problems related to social environment and problems with primary support group Axis V: 51-60 moderate symptoms  Past Medical History:  Past Medical History  Diagnosis Date  . Bipolar 1 disorder     History reviewed. No pertinent past surgical history.  Family History: No family history on file.  Social History:  reports that he has been smoking Cigarettes.  He has been smoking about 1.00 pack per day. He has never used smokeless tobacco. He reports that he drinks about 14.4 ounces of alcohol per week. He reports that he does not use illicit drugs.  Additional Social History:  Alcohol / Drug Use Pain Medications: Na Prescriptions: Na Over the Counter: NA History of alcohol / drug use?: Yes Substance #1 Name of Substance 1: Etoh 1 - Age of First Use: 14 1 - Amount (size/oz): "depends on who I'm drinkig with" 1 - Frequency: "It used to be daily but not now I'm not messing with that stuff because it's  church people helping me and I don't want to get kicked out of program" 1 - Duration: 04/16/13 1 - Last Use / Amount: "1 beer a friend gave me tonight"  CIWA: CIWA-Ar BP: 121/67 mmHg Pulse Rate: 75 COWS:    Allergies: No Known Allergies  Home Medications:  (Not in a hospital admission)  OB/GYN Status:  No LMP for male patient.  General Assessment Data Location of Assessment: [facility] Is this a Tele or Face-to-Face Assessment?: Face-to-Face Is this an Initial Assessment or a Re-assessment for this encounter?: Initial Assessment Living Arrangements: Other (Comment) (Homeless/ [employer]) Can pt return to current living arrangement?: Yes Admission Status: Voluntary Is patient capable of signing voluntary admission?: Yes Transfer from: Home Referral Source: MD  Medical Screening Exam [facility] Walk-in ONLY) Medical Exam completed: Yes  [facility] Crisis Care Plan Living Arrangements: Other (Comment) (Homeless/ [employer]) Name of Psychiatrist: Transport planner Name of Therapist: NA  Education Status Is patient currently in school?: No Current Grade: Na Highest grade of school patient has completed: Na Name of school: Na Contact person: NA  Risk to self Suicidal Ideation: No Suicidal Intent: No Is patient at risk for suicide?: No Suicidal Plan?: No Access to Means: No What has been your use of drugs/alcohol within the last 12 months?: Daily Previous Attempts/Gestures: No How many times?:  (None identified) Other Self Harm Risks:  (None identified) Triggers  for Past Attempts: None known Intentional Self Injurious Behavior: None Family Suicide History: No Recent stressful life event(s): Other (Comment) (Ran out of medications) Persecutory voices/beliefs?: No Depression: No Depression Symptoms:  (None identified) Substance abuse history and/or treatment for substance abuse?: Yes Suicide prevention information given to non-admitted patients: Not applicable  Risk to Others Homicidal  Ideation: No Thoughts of Harm to Others: No Current Homicidal Intent: No Current Homicidal Plan: No Access to Homicidal Means: No Identified Victim: Na History of harm to others?: No Assessment of Violence: None Noted Violent Behavior Description: Na Does patient have access to weapons?: No Criminal Charges Pending?: No Does patient have a court date: No  Psychosis Hallucinations: None noted Delusions: None noted  Mental Status Report Appear/Hygiene: Disheveled Eye Contact: Good Motor Activity: Freedom of movement;Unremarkable Speech: Rapid Level of Consciousness: Alert Mood: Anxious Affect: Anxious Anxiety Level: Minimal Thought Processes: Coherent;Relevant Judgement: Unimpaired Orientation: Person;Place;Time;Situation Obsessive Compulsive Thoughts/Behaviors: None  Cognitive Functioning Concentration: Decreased Memory: Recent Intact;Remote Intact IQ: Average Insight: Fair Impulse Control: Fair Appetite: Good Weight Loss:  (None noted) Weight Gain:  (None noted) Sleep: No Change Total Hours of Sleep:  (4-5 hours) Vegetative Symptoms: None  ADLScreening [facility] Assessment Services) Patient's cognitive ability adequate to safely complete daily activities?: Yes Patient able to express need for assistance with ADLs?: Yes Independently performs ADLs?: Yes (appropriate for developmental age)  Prior Inpatient Therapy Prior Inpatient Therapy: Yes Prior Therapy Dates:  (03/25/13) [facility] Reason for Treatment: off meds and manic  Prior Outpatient Therapy Prior Outpatient Therapy: No Prior Therapy Dates: na Prior Therapy Facilty/Provider(s): na Reason for Treatment: na  ADL Screening (condition at time of admission) Patient's cognitive ability adequate to safely complete daily activities?: Yes Is the patient deaf or have difficulty hearing?: No Does the patient have difficulty seeing, even when wearing glasses/contacts?: No Does the  patient have difficulty concentrating, remembering, or making decisions?: No Patient able to express need for assistance with ADLs?: Yes Does the patient have difficulty dressing or bathing?: No Independently performs ADLs?: Yes (appropriate for developmental age) Does the patient have difficulty walking or climbing stairs?: No Weakness of Legs: None Weakness of Arms/Hands: None  Home Assistive Devices/Equipment Home Assistive Devices/Equipment: None  Therapy Consults (therapy consults require a physician order) PT Evaluation Needed: No OT Evalulation Needed: No SLP Evaluation Needed: No Abuse/Neglect Assessment (Assessment to be complete while patient is alone) Physical Abuse: Denies Verbal Abuse: Denies Sexual Abuse: Denies Exploitation of patient/patient's resources: Denies Self-Neglect: Denies Values / Beliefs Cultural Requests During Hospitalization: None Spiritual Requests During Hospitalization: None Consults Spiritual Care Consult Needed: No Social Work Consult Needed: No Merchant navy officerAdvance Directives (For Healthcare) Advance Directive: Patient does not have advance directive;Patient would not like information    Additional Information 1:1 In Past 12 Months?: No CIRT Risk: No Elopement Risk: No Does patient have medical clearance?: Yes     Disposition:  Disposition Initial Assessment Completed for this Encounter: Yes Disposition of Patient: Outpatient treatment Type of inpatient treatment program: Adult Type of outpatient treatment: Adult (Patient formed eval this AM)  On Site Evaluation by:   Reviewed with Physician:    Rudi CocoMILLER, Zela Sobieski M 04/26/2013 6:46 AM

## 2013-04-26 NOTE — [facility] Suicide Risk Assessment (Cosign Needed)
Suicide Risk Assessment  Discharge Assessment     Demographic Factors:  Male, Caucasian, Low socioeconomic status and Unemployed  Total Time spent with patient: 20 minutes  Psychiatric Specialty Exam:     Blood pressure 121/67, pulse 75, temperature 97.8 F (36.6 C), temperature source Oral, resp. rate 18, SpO2 97.00%.There is no weight on file to calculate BMI.  General Appearance: Casual  Eye Contact::  Good  Speech:  Normal Rate  Volume:  Normal  Mood:  Euthymic  Affect:  Congruent  Thought Process:  Coherent  Orientation:  Full (Time, Place, and Person)  Thought Content:  WDL  Suicidal Thoughts:  No  Homicidal Thoughts:  No  Memory:  Immediate;   Good Recent;   Good Remote;   Good  Judgement:  Good  Insight:  Fair  Psychomotor Activity:  Normal  Concentration:  Good  Recall:  Good  Fund of Knowledge:Fair  Language: Fair  Akathisia:  No  Handed:  Right  AIMS (if indicated):     Assets:  Leisure Time Physical Health Resilience  Sleep:       Musculoskeletal: Strength & Muscle Tone: within normal limits Gait & Station: normal Patient leans: N/A   Mental Status Per Nursing Assessment::   On Admission:     Current Mental Status by Physician: NA  Loss Factors: NA  Historical Factors: NA  Risk Reduction Factors:   Positive coping skills or problem solving skills  Continued Clinical Symptoms:  Previous Psychiatric Diagnoses and Treatments  Cognitive Features That Contribute To Risk:  None   Suicide Risk:  Minimal: No identifiable suicidal ideation.  Patients presenting with no risk factors but with morbid ruminations; may be classified as minimal risk based on the severity of the depressive symptoms  Discharge Diagnoses:   AXIS I:  Bipolar, Depressed AXIS II:  Deferred AXIS III:   Past Medical History  Diagnosis Date  . Bipolar 1 disorder    AXIS IV:  economic problems and problems with primary support group AXIS V:  61-70 mild  symptoms  Plan Of Care/Follow-up recommendations:  Activity:  as tolerated Diet:  low-sodium heart healthy diet  Is patient on multiple antipsychotic therapies at discharge:  No   Has Patient had three or more failed trials of antipsychotic monotherapy by history:  No  Recommended Plan for Multiple Antipsychotic Therapies: NA    Jovon Streetman, PMH-NP 04/26/2013, 10:50 AM

## 2013-04-26 NOTE — ED Provider Notes (Signed)
Medical screening examination/treatment/procedure(s) were performed by non-physician practitioner and as supervising physician I was immediately available for consultation/collaboration.  EKG Interpretation   None         Amadeo Coke T Jernard Reiber, MD 04/26/13 1853 
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
## 2015-01-15 IMAGING — CR DG ELBOW COMPLETE 3+V*R*
4 series · 4 of 4 positions shown · non-contrast
Comparison: None.

CLINICAL DATA: Alcohol intoxication.  Pain.

EXAM:
RIGHT ELBOW - COMPLETE 3+ VIEW

[x elbow ap right]
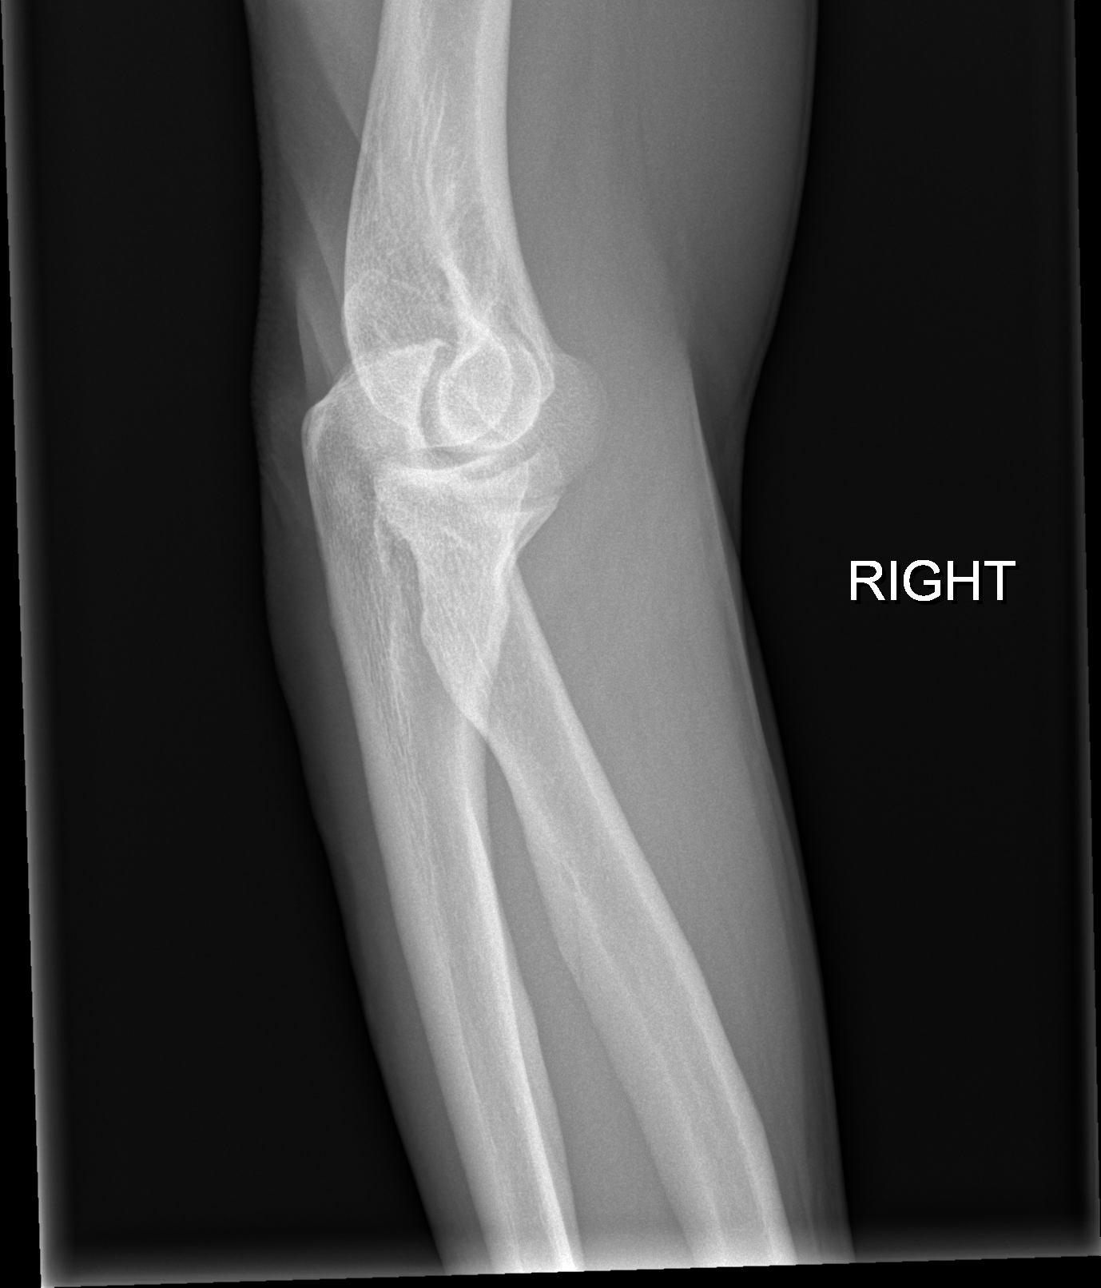

[x elbow obl right (1 of 3)]
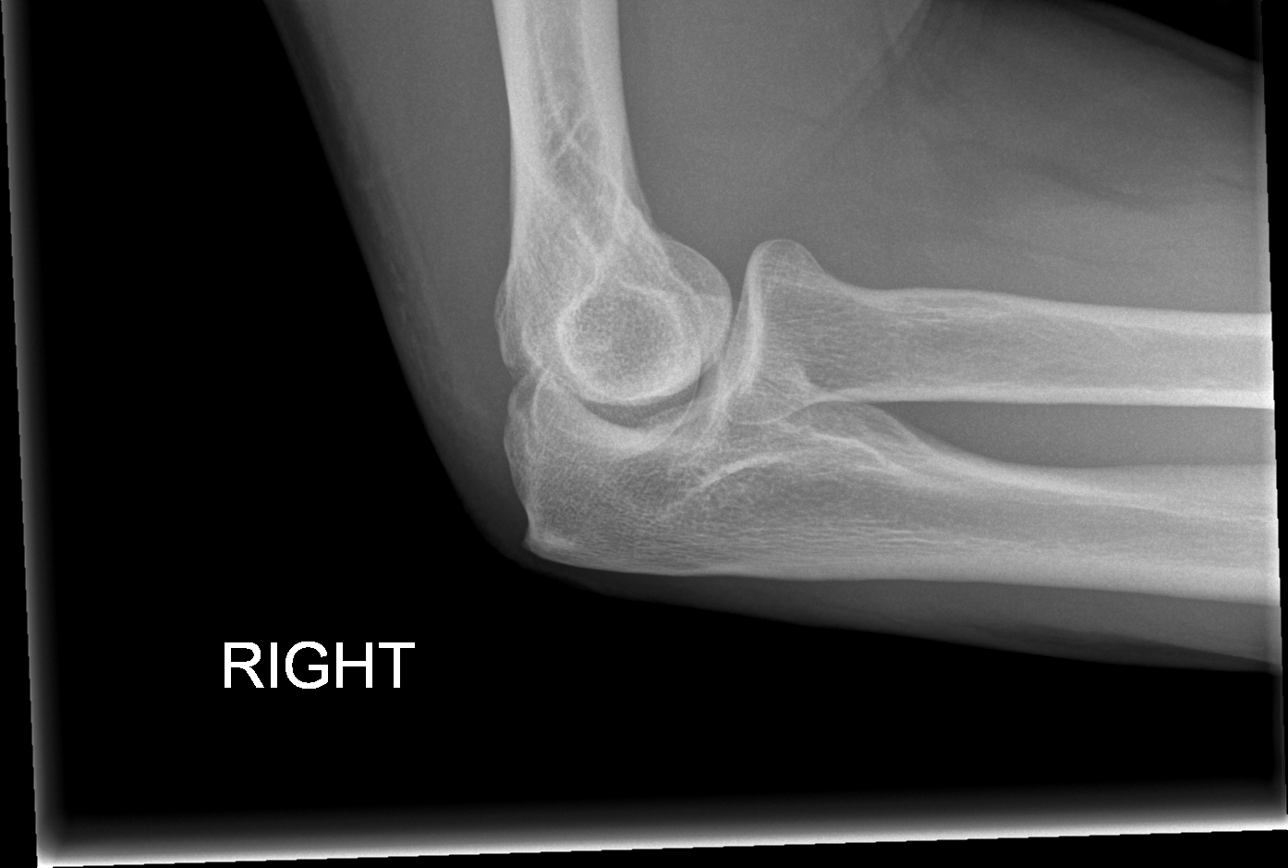

[x elbow obl right (2 of 3)]
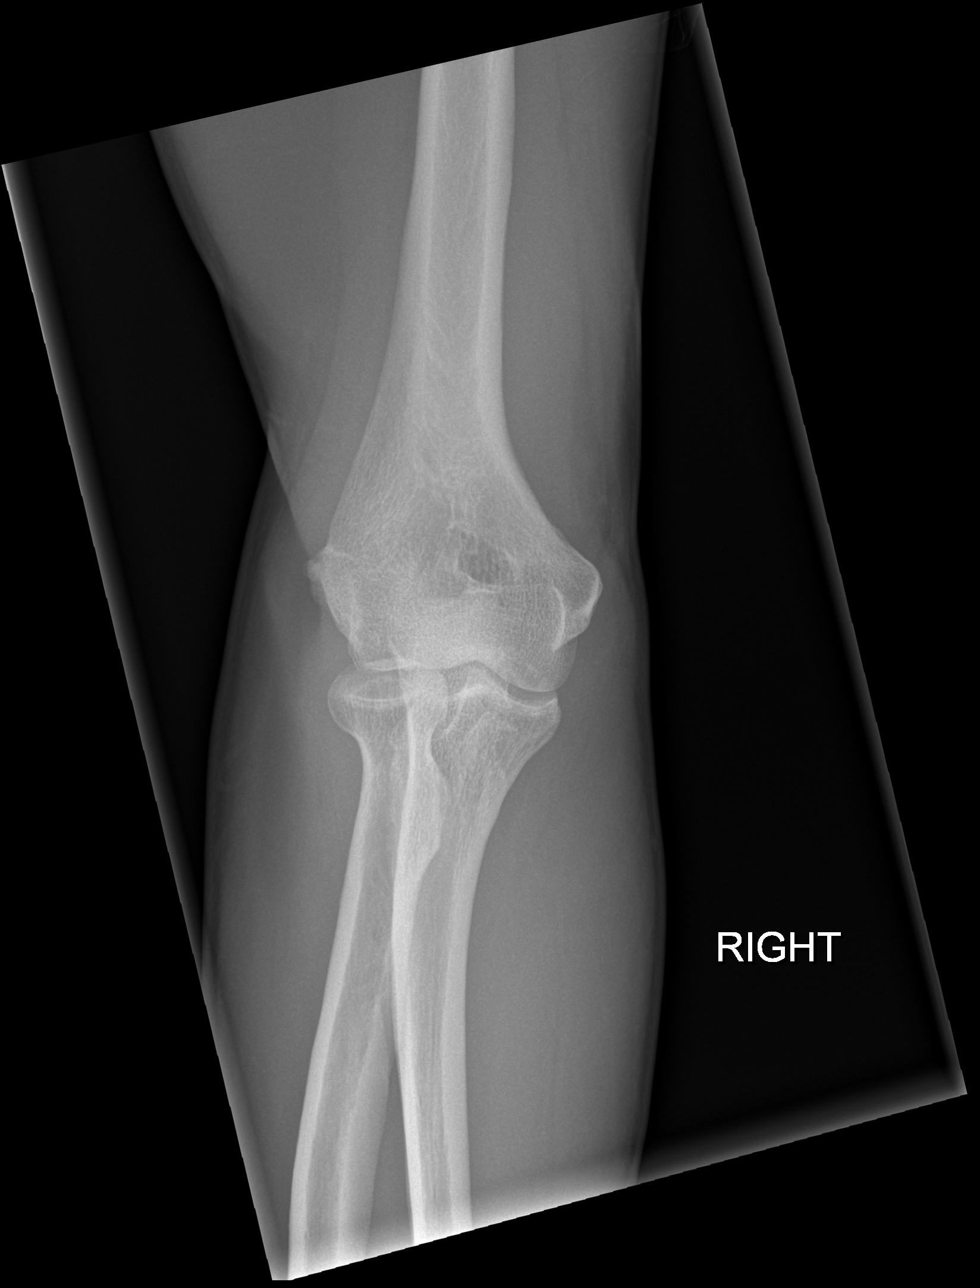

[x elbow obl right (3 of 3)]
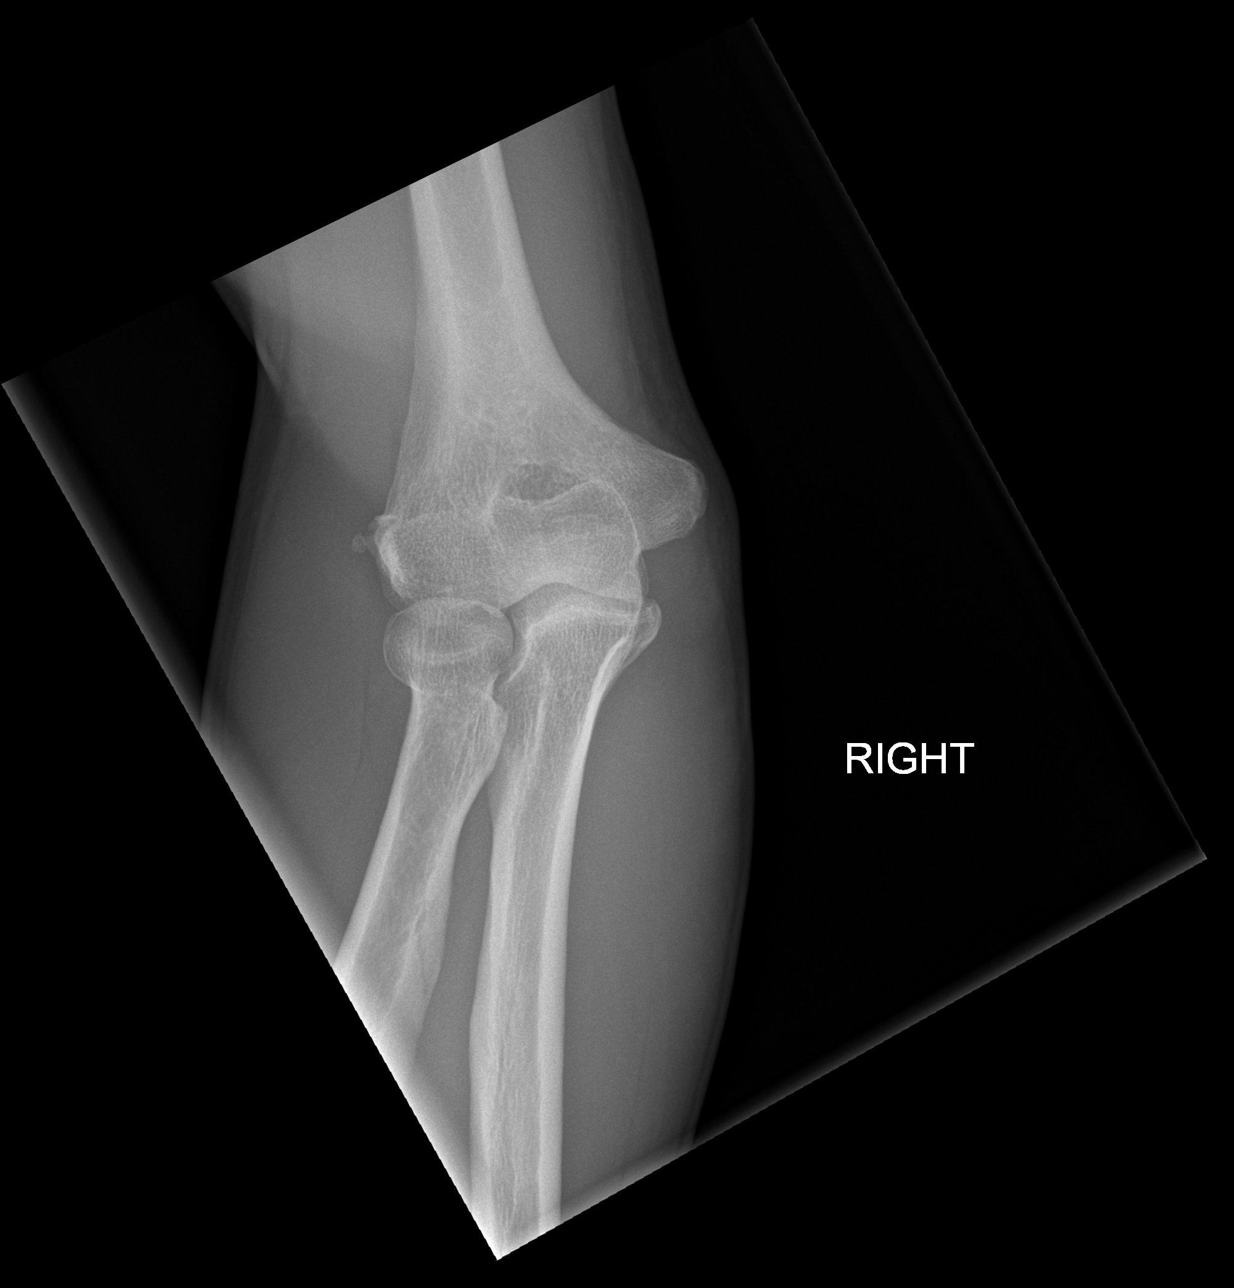

[4 of 4 positions shown; findings below may reference images not displayed]

FINDINGS: There is no evidence of fracture, dislocation, or joint effusion.
There is no evidence of arthropathy or other focal bone abnormality.
Soft tissues are unremarkable
IMPRESSION: No acute findings.

## 2015-01-15 IMAGING — CR DG ELBOW COMPLETE 3+V*L*
4 series · 4 of 4 positions shown · non-contrast
Comparison: None.

CLINICAL DATA: Alcohol intoxication. Pain.

EXAM:
LEFT ELBOW - COMPLETE 3+ VIEW

[x elbow ap left]
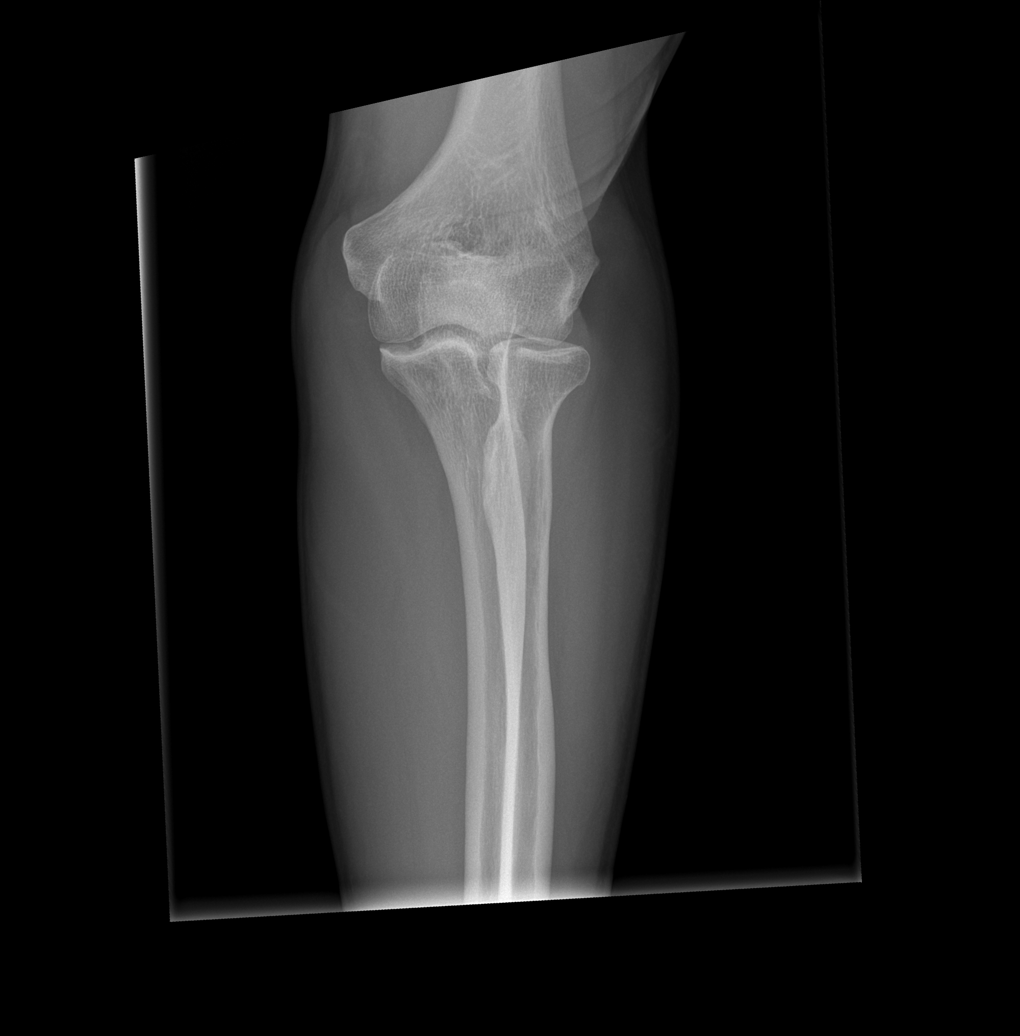

[x elbow obl left (1 of 2)]
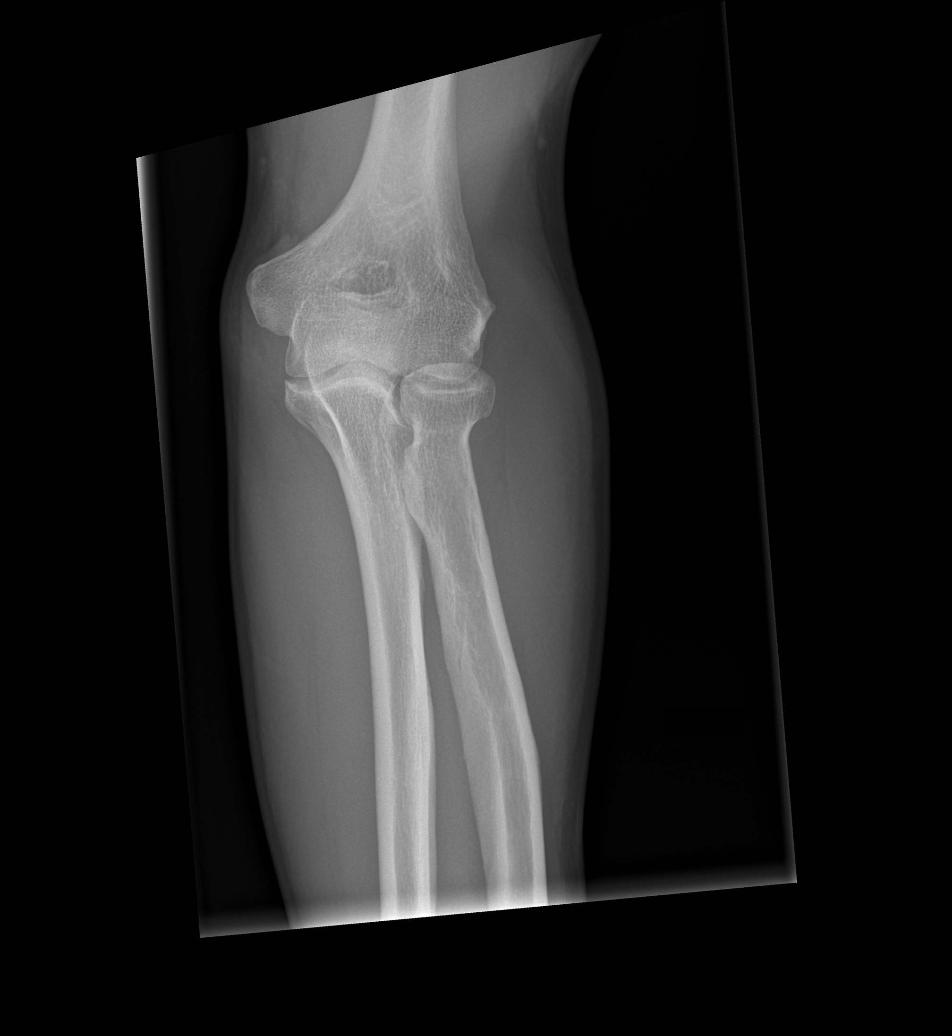

[x elbow obl left (2 of 2)]
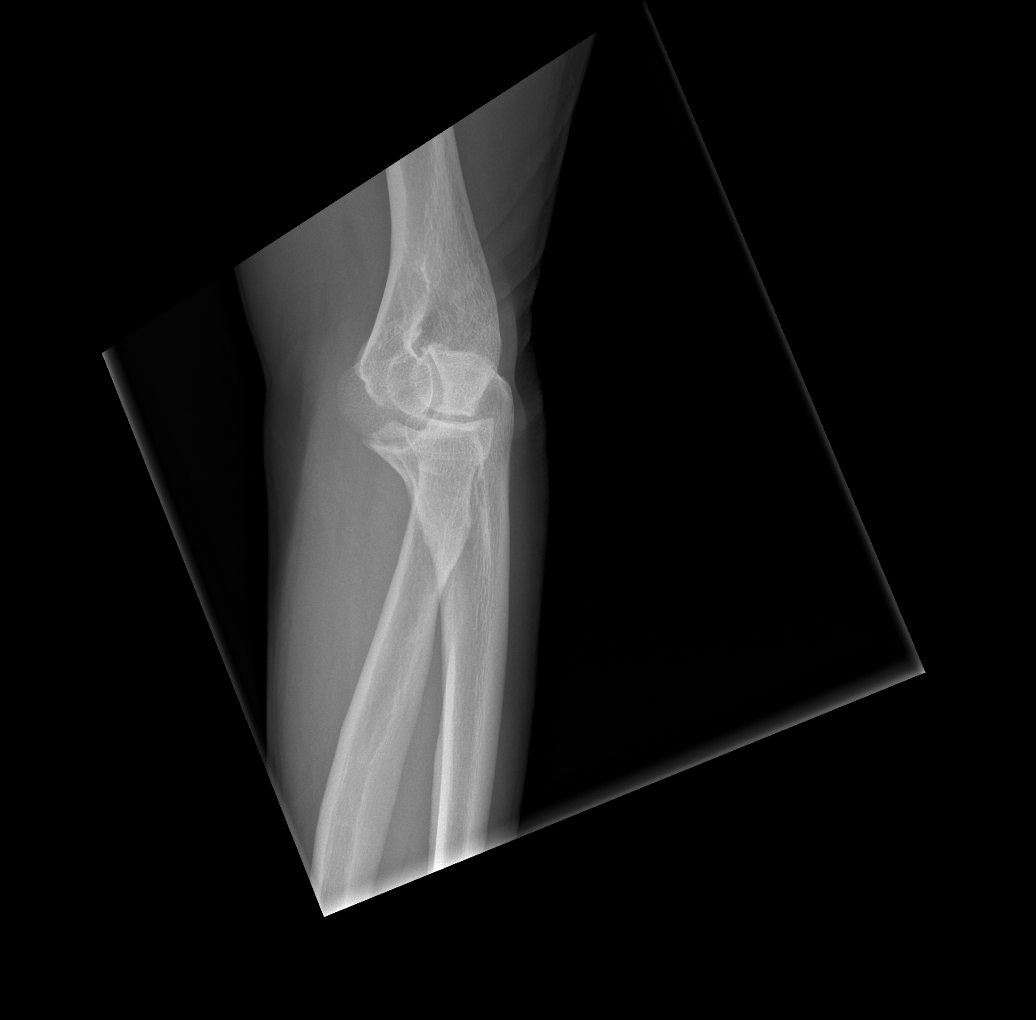

[x elbow lat left]
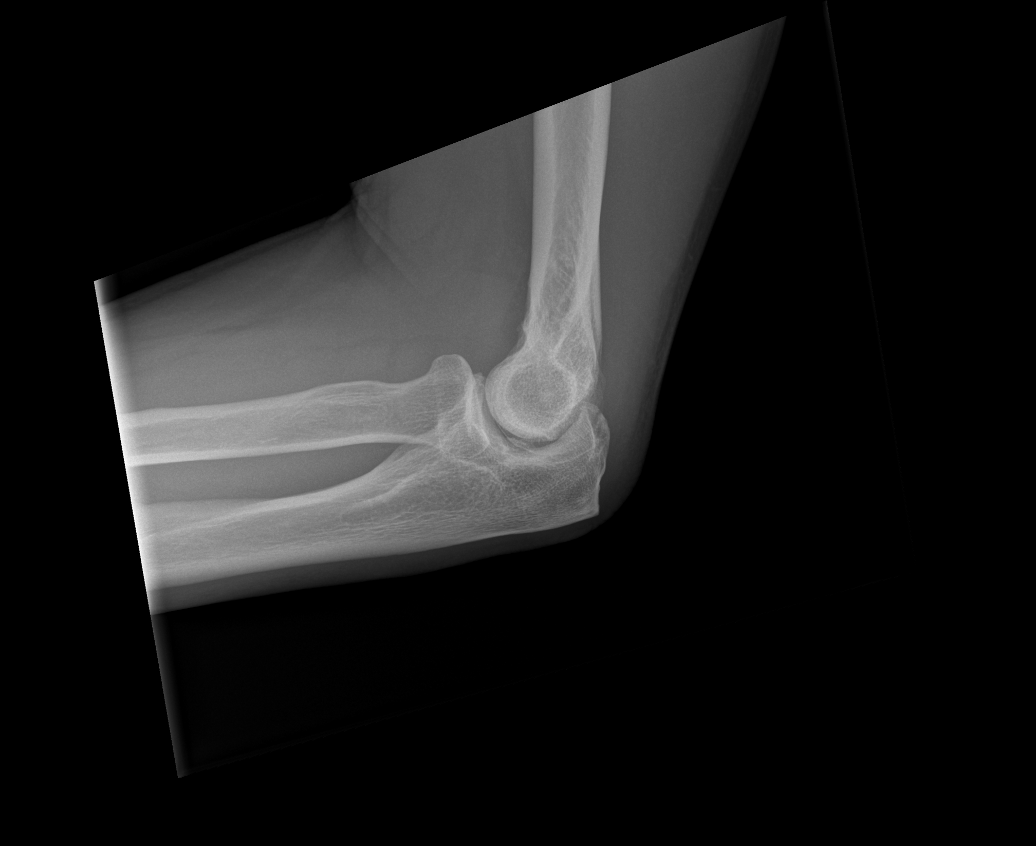

[4 of 4 positions shown; findings below may reference images not displayed]

FINDINGS: There is no evidence of fracture, dislocation, or joint effusion.
There is no evidence of arthropathy or other focal bone abnormality.
Soft tissues are unremarkable
IMPRESSION: Negative.
